# Patient Record
Sex: Male | Born: 1935 | Race: White | Hispanic: No | Smoking: Never smoker
Health system: Southern US, Community
[De-identification: ages and names within clinical notes are randomized; demographics above are authoritative.]

## PROBLEM LIST (undated history)

## (undated) DIAGNOSIS — E119 Type 2 diabetes mellitus without complications: Secondary | ICD-10-CM

## (undated) DIAGNOSIS — K635 Polyp of colon: Secondary | ICD-10-CM

## (undated) DIAGNOSIS — I10 Essential (primary) hypertension: Secondary | ICD-10-CM

## (undated) DIAGNOSIS — E7800 Pure hypercholesterolemia, unspecified: Secondary | ICD-10-CM

## (undated) HISTORY — PX: COLONOSCOPY: SHX174

## (undated) HISTORY — PX: KIDNEY STONE SURGERY: SHX686

## (undated) HISTORY — PX: OTHER SURGICAL HISTORY: SHX169

---

## 2002-07-14 ENCOUNTER — Ambulatory Visit (HOSPITAL_COMMUNITY): Admission: RE | Admit: 2002-07-14 | Discharge: 2002-07-14 | Payer: Self-pay | Admitting: Internal Medicine

## 2003-09-21 ENCOUNTER — Observation Stay (HOSPITAL_COMMUNITY): Admission: RE | Admit: 2003-09-21 | Discharge: 2003-09-22 | Payer: Self-pay | Admitting: General Surgery

## 2005-10-28 ENCOUNTER — Encounter (INDEPENDENT_AMBULATORY_CARE_PROVIDER_SITE_OTHER): Payer: Self-pay | Admitting: Specialist

## 2005-10-28 ENCOUNTER — Ambulatory Visit: Payer: Self-pay | Admitting: Internal Medicine

## 2005-10-28 ENCOUNTER — Ambulatory Visit (HOSPITAL_COMMUNITY): Admission: RE | Admit: 2005-10-28 | Discharge: 2005-10-28 | Payer: Self-pay | Admitting: Internal Medicine

## 2010-04-11 ENCOUNTER — Ambulatory Visit (HOSPITAL_COMMUNITY): Admission: RE | Admit: 2010-04-11 | Discharge: 2010-04-11 | Payer: Self-pay | Admitting: Urology

## 2010-12-06 ENCOUNTER — Other Ambulatory Visit (INDEPENDENT_AMBULATORY_CARE_PROVIDER_SITE_OTHER): Payer: Self-pay | Admitting: Internal Medicine

## 2010-12-06 ENCOUNTER — Ambulatory Visit (HOSPITAL_COMMUNITY)
Admission: RE | Admit: 2010-12-06 | Discharge: 2010-12-06 | Disposition: A | Discharge status: Home / Self Care | Payer: Medicare Other | Source: Ambulatory Visit | Attending: Internal Medicine | Admitting: Internal Medicine

## 2010-12-06 ENCOUNTER — Encounter (HOSPITAL_BASED_OUTPATIENT_CLINIC_OR_DEPARTMENT_OTHER): Payer: Medicare Other | Admitting: Internal Medicine

## 2010-12-06 DIAGNOSIS — D126 Benign neoplasm of colon, unspecified: Secondary | ICD-10-CM

## 2010-12-06 DIAGNOSIS — Z860100 Personal history of colon polyps, unspecified: Secondary | ICD-10-CM | POA: Insufficient documentation

## 2010-12-06 DIAGNOSIS — Z7982 Long term (current) use of aspirin: Secondary | ICD-10-CM | POA: Insufficient documentation

## 2010-12-06 DIAGNOSIS — E119 Type 2 diabetes mellitus without complications: Secondary | ICD-10-CM | POA: Insufficient documentation

## 2010-12-06 DIAGNOSIS — Z09 Encounter for follow-up examination after completed treatment for conditions other than malignant neoplasm: Secondary | ICD-10-CM | POA: Insufficient documentation

## 2010-12-06 LAB — GLUCOSE, CAPILLARY: Glucose-Capillary: 83 mg/dL (ref 70–99)

## 2010-12-10 ENCOUNTER — Other Ambulatory Visit (HOSPITAL_COMMUNITY): Payer: Medicare Other

## 2010-12-11 ENCOUNTER — Ambulatory Visit (HOSPITAL_COMMUNITY)
Admit: 2010-12-11 | Discharge: 2010-12-11 | Disposition: A | Discharge status: Home / Self Care | Payer: Medicare Other | Source: Ambulatory Visit | Attending: Internal Medicine | Admitting: Internal Medicine

## 2010-12-11 DIAGNOSIS — K224 Dyskinesia of esophagus: Secondary | ICD-10-CM | POA: Insufficient documentation

## 2010-12-11 DIAGNOSIS — R1310 Dysphagia, unspecified: Secondary | ICD-10-CM | POA: Insufficient documentation

## 2010-12-17 ENCOUNTER — Ambulatory Visit (HOSPITAL_COMMUNITY)
Admission: RE | Admit: 2010-12-17 | Discharge: 2010-12-17 | Disposition: A | Discharge status: Home / Self Care | Payer: Medicare Other | Source: Ambulatory Visit | Attending: Internal Medicine | Admitting: Internal Medicine

## 2010-12-17 DIAGNOSIS — R1310 Dysphagia, unspecified: Secondary | ICD-10-CM | POA: Insufficient documentation

## 2010-12-17 DIAGNOSIS — IMO0001 Reserved for inherently not codable concepts without codable children: Secondary | ICD-10-CM | POA: Insufficient documentation

## 2010-12-17 DIAGNOSIS — K224 Dyskinesia of esophagus: Secondary | ICD-10-CM | POA: Insufficient documentation

## 2010-12-21 NOTE — Op Note (Signed)
NAMESOREN, LAZARZ NO.:  [id number]   MEDICAL RECORD NO.:  [id number]          PATIENT TYPE:  AMB   LOCATION:  DAY                           FACILITY:  [facility]   PHYSICIAN:  Lionel December, M.D.    DATE OF BIRTH:  1930/11/11   DATE OF PROCEDURE:  10/28/2005  DATE OF DISCHARGE:                                 OPERATIVE REPORT   PROCEDURE:  Colonoscopy with removal of residual polyp via cold biopsy and  APC.   INDICATION:  Mr. Mizrahi is a 77 year old Caucasian male who underwent  screening colonoscopy in December 2003.  He was noted to have over a 2-cm  polyp at sigmoid colon which was a tubulovillous adenoma; and he had a  smaller tubular adenoma.  We, therefore, asked him to return at 3 years.  He  remains free of symptoms. Family history is negative for colorectal  carcinoma.  Procedure and risks were reviewed with the patient and informed  consent was obtained.   MEDICINES FOR CONSCIOUS SEDATION:  Demerol 25 mg IV, Versed 3 mg IV, and  atropine 0.5 mg IV for asymptomatic bradycardia.   FINDINGS:  Procedure performed in endoscopy suite.  The patient's vital  signs and O2 saturations were monitored during procedure and remained  stable.  The patient was placed in the left lateral position and rectal  examination performed.  No abnormality noted on external or digital exam.  Olympus videoscope was placed in the rectum and advanced under vision into  sigmoid colon and beyond.  Preparation was satisfactory, a very redundant  colon.  Some difficulty encountered in passing the scope across the hepatic  flexure into cecum which was finally possible with him, on his back, and  using abdominal pressure.  Cecum was identified by ileocecal valve and  appendiceal orifice.  Pictures taken for the record.  As the scope was  withdrawn colonic mucosa was carefully examined.  There was a residual polyp  at polypectomy site at sigmoid colon marked by scar.  This polyp was  ablated  via cold biopsy; and polypectomy site was treated with APC to make sure that  all of the residual polyp was removed and/or treated.  Pictures taken for  the record.  Mucosa of the distal sigmoid colon and rectum was normal.  Scope was retroflexed to examine anorectal junction and small hemorrhoids  were noted below the dentate line.  Endoscope was straightened and  withdrawn. The patient tolerated the procedure well.   FINAL DIAGNOSIS:  1.  Redundant colon.  2.  Residual polyp at previous polypectomy site which was removed via cold      biopsy and site treated with APC to treat the tiny foci left behind.  3.  External hemorrhoids.   RECOMMENDATIONS:  1.  Standard instructions given.  2.  He will resume his aspirin in 10 days.  3.  I will contact the patient with biopsy results.  4.  We will bring him back in 5 years.      Lionel December, M.D.  Electronically Signed  NR/MEDQ  D:  10/28/2005  T:  10/28/2005  Job:  [id number]

## 2010-12-21 NOTE — Op Note (Signed)
NAME:  Kyle Santana, Kyle Santana                             ACCOUNT NO.:  [id number]   MEDICAL RECORD NO.:  [id number]                   PATIENT TYPE:  AMB   LOCATION:  DAY                                  FACILITY:  [facility]   PHYSICIAN:  Lionel December, M.D.                 DATE OF BIRTH:  03/03/1931   DATE OF PROCEDURE:  DATE OF DISCHARGE:                                 OPERATIVE REPORT   PROCEDURE PERFORMED:  Total colonoscopy with polypectomy.   INDICATIONS FOR PROCEDURE:  The patient is a 77 year old Caucasian male who  is undergoing screening colonoscopy.  Family history is negative for CRC.  The risks were reviewed with the patient.  Informed consent was obtained.   PREMEDICATION:  Demerol 15 mg IV, Versed 3 mg IV in divided doses and  Atropine 0.3 mg IV.   INSTRUMENT:  Olympus video system.   FINDINGS:  The procedure performed in the endoscopy suite.  The patient's  vital signs and O2 saturations were monitored during the procedure and  remained stable except he had asymptomatic bradycardia, even prior to  starting the procedure.  He was given a small dose of Atropine to prevent  vasovagal phenomenon.  The patient was placed in the left lateral decubitus  position.  The rectal examination was performed.  No abnormality was noted  on external or digital exam.  The scope was placed in the rectum and  advanced under direct vision into the sigmoid colon and beyond.  The  preparation was excellent.  The scope was passed into the cecum.  The cecum  was identified by the appendiceal orifice and the ileocecal valve.  The  colon was somewhat redundant.  As the scope was withdrawn, the colonic  mucosa was carefully examined, was normal except the distal sigmoid colon  really had a large polyp which was about 3 cm in diameter and a small polyp  which was 8-9 mm.  Both of these polyps were snared and retrieved for  histologic examination.  The mucosa of the distal sigmoid colon and rectum  was  normal.  The scope was retroflexed and examined.  Anorectal junction  hemorrhoids were noted below the dentate line.  The endoscope was  straightened and withdrawn.  The patient tolerated the procedure well.   FINAL DIAGNOSES:  1. Examination was performed to the cecum.  2. Two polyps snared from the sigmoid colon, the larger of the two was 3 cm     in diameter, the other one was 8-9 mm.  3. External hemorrhoids.    RECOMMENDATIONS:  1. Standard instructions given.  I will be contacting the patient with     biopsy results.  2. Will consider bringing him back in three years from now given     colonoscopic findings.  Lionel December, M.D.    NR/MEDQ  D:  07/14/2002  T:  07/14/2002  Job:  [id number]   cc:   Dorise Hiss, M.D.  [address]  [place]  [place] [address]  Fax: [phone]

## 2010-12-21 NOTE — Op Note (Signed)
NAME:  Kyle Santana, Kyle Santana NO.:  [id number]   MEDICAL RECORD NO.:  [id number]                   PATIENT TYPE:  AMB   LOCATION:  DAY                                  FACILITY:  [facility]   PHYSICIAN:  Barbaraann Barthel, M.D.              DATE OF BIRTH:  12/19/1930   DATE OF PROCEDURE:  09/21/2003  DATE OF DISCHARGE:                                 OPERATIVE REPORT   SURGEON:  Barbaraann Barthel, M.D.   PREOPERATIVE DIAGNOSES:  Left inguinal hernia.   POSTOPERATIVE DIAGNOSES:  Left inguinal hernia.   PROCEDURE:  Left inguinal hernia repair, modified McVay repair with mesh  plug.   Note this is a 77 year old male who was seen in my office with a left  inguinal hernia. We discussed elective repair. I did note that he had some  bradycardia and he was a type 2 diabetic and I referred him to the  cardiologist to clear for surgery.  He was cleared for surgery and then we  took him to surgery via the outpatient department after discussing the  procedure with him in detail and discussing the complications not limited to  but including bleeding, infection and recurrence. Informed consent was  obtained.   FINDINGS:  The patient had a large direct inguinal hernia, no indirect  component.   SPECIMENS:  None.   The patient was taken to the operating room and after adequate  administration of spinal anesthesia, his entire abdomen was prepped with  Betadine solution and draped in the usual manner and prior to this a Foley  catheter was aseptically inserted.  A low transverse incision was carried  out through skin, subcutaneous tissue and Scarpa's layer down to the  external oblique which was opened. The cord structures were dissected free  from a medial large direct hernia sac.  I elected to place a moderate mesh  plug in inverting the hernia sac within the abdominal wall and then suturing  this plug circumferentially with old novofil suture to the fascia  circumferentially.  I then sutured transversalis abdominis and transversus  fascia to Cooper's ligament and Poupart's ligament with interrupted 2-0  Bralon sutures. We then irrigated with normal saline solution, I performed a  relaxing excision prior to suturing the Bralon sutures tightly. We then used  approximately 8 mL of 0.5%  Sensorcaine to add with postoperative comfort  and then I closed the external oblique with a running 3-0 Polysorb suture  and the subcu with a couple of interrupted  3-0 Polysorb sutures and then irrigated the subcu and closed with a stapling  device. Prior to closure, all sponge, needle and instrument counts were  correct.  Estimated blood loss was minimal. The patient received  approximately 450 mL of crystalloids intraoperatively.  No drains were  placed, there were no complications.      ___________________________________________  Barbaraann Barthel, M.D.   WB/MEDQ  D:  09/21/2003  T:  09/22/2003  Job:  [id number]   cc:   [facility]

## 2010-12-25 NOTE — Op Note (Signed)
  NAMEPATTRICK, BADY NO.:  [id number]  MEDICAL RECORD NO.:  [id number]           PATIENT TYPE:  O  LOCATION:  DAYP                          FACILITY:  [facility]  PHYSICIAN:  Lionel December, M.D.    DATE OF BIRTH:  May 23, 1931  DATE OF PROCEDURE:  12/06/2010 DATE OF DISCHARGE:                              OPERATIVE REPORT   PROCEDURE:  Colonoscopy with snare polypectomy.  INDICATIONS:  Vidit is 77 year old Caucasian male with history of colonic polyps.  He had a large tubulovillous adenoma removed from the sigmoid colon back in 2003.  His last exam was in 2007, and he had possibly residual polyp removed which was an adenoma.  He is undergoing surveillance colonoscopy.  Procedures were reviewed with the patient. Informed consent was obtained.  MEDS FOR CONSCIOUS SEDATION:  Demerol 25 mg IV, Versed 3 mg IV, atropine 0.5 mg IV given prior to the procedure for a symptomatic bradycardia.  FINDINGS:  Procedure performed in endoscopy suite.  The patient's vital signs and O2 sat were monitored during the procedure and remained stable.  The patient was placed in left lateral recumbent position. Rectal examination performed.  No abnormality noted on external or digital exam.  Pentax videoscope was placed through rectum and advanced under vision into sigmoid colon and beyond.  Preparation was excellent. Very redundant colon.  Scope was passed into cecum which was identified by appendiceal orifice and ileocecal valve.  As the scope was withdrawn, colonic mucosa was carefully examined.  There was 7-8 mm polyp at splenic flexure with difficult approach and was finally snared. Polypectomy was complete.  Polyp was retrieved, but broke into two pieces.  Mucosa and rest of the colon was normal.  Scar was noted at sigmoid colon site where he had large adenoma removed 9 years ago. Rectal mucosa was normal.  Scope was retroflexed to examine anorectal junction which was unremarkable.   Endoscope was then withdrawn. Withdrawal time was over 20 minutes.  The patient tolerated the procedure well.  FINAL DIAGNOSIS: 1. Examination performed to cecum. 2. Redundant colon. 3. A 7-8 mm polyp snared from splenic flexure. 4. Scar noted at sigmoid colon site of previous polypectomy.  RECOMMENDATIONS: 1. Standard instructions given. 2. No aspirin for 10 days.  We will schedule him for barium pill     esophagogram to further evaluate his dysphagia which apparently he     has been experienced for the last 8 months.          ______________________________ Lionel December, M.D.     NR/MEDQ  D:  12/06/2010  T:  12/06/2010  Job:  [id number]  cc:   Lia Hopping Fax: [phone]  Electronically Signed by Lionel December M.D. on 12/25/2010 12:22:39 PM

## 2011-05-03 DIAGNOSIS — N200 Calculus of kidney: Secondary | ICD-10-CM | POA: Insufficient documentation

## 2011-05-03 DIAGNOSIS — N289 Disorder of kidney and ureter, unspecified: Secondary | ICD-10-CM | POA: Insufficient documentation

## 2011-10-31 DIAGNOSIS — N390 Urinary tract infection, site not specified: Secondary | ICD-10-CM | POA: Insufficient documentation

## 2012-07-10 ENCOUNTER — Ambulatory Visit (INDEPENDENT_AMBULATORY_CARE_PROVIDER_SITE_OTHER): Payer: Medicare Other | Admitting: Urology

## 2012-07-10 ENCOUNTER — Ambulatory Visit (HOSPITAL_COMMUNITY)
Admission: RE | Admit: 2012-07-10 | Discharge: 2012-07-10 | Disposition: A | Discharge status: Home / Self Care | Payer: Medicare Other | Source: Ambulatory Visit | Attending: Urology | Admitting: Urology

## 2012-07-10 ENCOUNTER — Other Ambulatory Visit: Payer: Self-pay | Admitting: Urology

## 2012-07-10 DIAGNOSIS — N390 Urinary tract infection, site not specified: Secondary | ICD-10-CM

## 2012-07-10 DIAGNOSIS — N200 Calculus of kidney: Secondary | ICD-10-CM

## 2012-07-10 DIAGNOSIS — N476 Balanoposthitis: Secondary | ICD-10-CM

## 2012-07-10 DIAGNOSIS — N402 Nodular prostate without lower urinary tract symptoms: Secondary | ICD-10-CM

## 2012-07-13 ENCOUNTER — Ambulatory Visit (HOSPITAL_COMMUNITY)
Admission: RE | Admit: 2012-07-13 | Discharge: 2012-07-13 | Disposition: A | Discharge status: Home / Self Care | Payer: Medicare Other | Source: Ambulatory Visit | Attending: Urology | Admitting: Urology

## 2012-07-13 ENCOUNTER — Other Ambulatory Visit (HOSPITAL_COMMUNITY): Payer: Medicare Other

## 2012-07-13 DIAGNOSIS — N200 Calculus of kidney: Secondary | ICD-10-CM | POA: Insufficient documentation

## 2012-07-16 ENCOUNTER — Ambulatory Visit (HOSPITAL_COMMUNITY): Payer: Medicare Other

## 2012-12-11 ENCOUNTER — Other Ambulatory Visit: Payer: Self-pay | Admitting: Urology

## 2012-12-11 DIAGNOSIS — N200 Calculus of kidney: Secondary | ICD-10-CM

## 2013-01-08 ENCOUNTER — Ambulatory Visit (HOSPITAL_COMMUNITY): Payer: Medicare Other

## 2013-01-11 ENCOUNTER — Ambulatory Visit (HOSPITAL_COMMUNITY)
Admission: RE | Admit: 2013-01-11 | Discharge: 2013-01-11 | Disposition: A | Discharge status: Home / Self Care | Payer: Medicare Other | Source: Ambulatory Visit | Attending: Urology | Admitting: Urology

## 2013-01-11 ENCOUNTER — Other Ambulatory Visit: Payer: Self-pay | Admitting: Urology

## 2013-01-11 ENCOUNTER — Ambulatory Visit (HOSPITAL_COMMUNITY): Payer: Medicare Other

## 2013-01-11 DIAGNOSIS — N200 Calculus of kidney: Secondary | ICD-10-CM | POA: Insufficient documentation

## 2013-01-11 DIAGNOSIS — Z09 Encounter for follow-up examination after completed treatment for conditions other than malignant neoplasm: Secondary | ICD-10-CM | POA: Insufficient documentation

## 2013-01-15 ENCOUNTER — Ambulatory Visit (INDEPENDENT_AMBULATORY_CARE_PROVIDER_SITE_OTHER): Payer: Medicare Other | Admitting: Urology

## 2013-01-15 DIAGNOSIS — N135 Crossing vessel and stricture of ureter without hydronephrosis: Secondary | ICD-10-CM

## 2013-01-15 DIAGNOSIS — N390 Urinary tract infection, site not specified: Secondary | ICD-10-CM

## 2013-01-15 DIAGNOSIS — N200 Calculus of kidney: Secondary | ICD-10-CM

## 2013-10-15 ENCOUNTER — Other Ambulatory Visit: Payer: Self-pay | Admitting: Urology

## 2013-10-15 DIAGNOSIS — N200 Calculus of kidney: Secondary | ICD-10-CM

## 2013-12-20 ENCOUNTER — Ambulatory Visit (HOSPITAL_COMMUNITY)
Admission: RE | Admit: 2013-12-20 | Discharge: 2013-12-20 | Disposition: A | Discharge status: Home / Self Care | Payer: Medicare Other | Source: Ambulatory Visit | Attending: Urology | Admitting: Urology

## 2013-12-20 ENCOUNTER — Other Ambulatory Visit: Payer: Self-pay | Admitting: Urology

## 2013-12-20 DIAGNOSIS — N200 Calculus of kidney: Secondary | ICD-10-CM

## 2013-12-20 DIAGNOSIS — M47817 Spondylosis without myelopathy or radiculopathy, lumbosacral region: Secondary | ICD-10-CM | POA: Insufficient documentation

## 2013-12-20 DIAGNOSIS — K5900 Constipation, unspecified: Secondary | ICD-10-CM | POA: Insufficient documentation

## 2013-12-24 ENCOUNTER — Ambulatory Visit (INDEPENDENT_AMBULATORY_CARE_PROVIDER_SITE_OTHER): Payer: Medicare Other | Admitting: Urology

## 2013-12-24 DIAGNOSIS — N200 Calculus of kidney: Secondary | ICD-10-CM

## 2013-12-24 DIAGNOSIS — N390 Urinary tract infection, site not specified: Secondary | ICD-10-CM

## 2013-12-24 DIAGNOSIS — N476 Balanoposthitis: Secondary | ICD-10-CM

## 2014-11-22 ENCOUNTER — Other Ambulatory Visit: Payer: Self-pay | Admitting: Urology

## 2014-11-22 DIAGNOSIS — N200 Calculus of kidney: Secondary | ICD-10-CM

## 2014-12-22 ENCOUNTER — Ambulatory Visit (HOSPITAL_COMMUNITY)
Admission: RE | Admit: 2014-12-22 | Discharge: 2014-12-22 | Disposition: A | Discharge status: Home / Self Care | Payer: Medicare Other | Source: Ambulatory Visit | Attending: Urology | Admitting: Urology

## 2014-12-22 DIAGNOSIS — N200 Calculus of kidney: Secondary | ICD-10-CM | POA: Diagnosis present

## 2014-12-22 DIAGNOSIS — N281 Cyst of kidney, acquired: Secondary | ICD-10-CM | POA: Insufficient documentation

## 2014-12-22 DIAGNOSIS — R934 Abnormal findings on diagnostic imaging of urinary organs: Secondary | ICD-10-CM | POA: Diagnosis not present

## 2014-12-30 ENCOUNTER — Ambulatory Visit (INDEPENDENT_AMBULATORY_CARE_PROVIDER_SITE_OTHER): Payer: Medicare Other | Admitting: Urology

## 2014-12-30 DIAGNOSIS — R339 Retention of urine, unspecified: Secondary | ICD-10-CM | POA: Diagnosis not present

## 2015-11-01 DIAGNOSIS — E1142 Type 2 diabetes mellitus with diabetic polyneuropathy: Secondary | ICD-10-CM | POA: Diagnosis not present

## 2015-11-01 DIAGNOSIS — Z6820 Body mass index (BMI) 20.0-20.9, adult: Secondary | ICD-10-CM | POA: Diagnosis not present

## 2015-11-01 DIAGNOSIS — I10 Essential (primary) hypertension: Secondary | ICD-10-CM | POA: Diagnosis not present

## 2015-11-01 DIAGNOSIS — E784 Other hyperlipidemia: Secondary | ICD-10-CM | POA: Diagnosis not present

## 2015-11-01 DIAGNOSIS — Z0000 Encounter for general adult medical examination without abnormal findings: Secondary | ICD-10-CM | POA: Diagnosis not present

## 2015-11-09 DIAGNOSIS — Z961 Presence of intraocular lens: Secondary | ICD-10-CM | POA: Diagnosis not present

## 2015-12-19 ENCOUNTER — Encounter (INDEPENDENT_AMBULATORY_CARE_PROVIDER_SITE_OTHER): Payer: Self-pay | Admitting: *Deleted

## 2016-01-03 ENCOUNTER — Other Ambulatory Visit (INDEPENDENT_AMBULATORY_CARE_PROVIDER_SITE_OTHER): Payer: Self-pay | Admitting: *Deleted

## 2016-01-03 ENCOUNTER — Ambulatory Visit: Payer: Medicare Other | Admitting: Urology

## 2016-01-03 DIAGNOSIS — Z86010 Personal history of colonic polyps: Secondary | ICD-10-CM

## 2016-01-19 ENCOUNTER — Other Ambulatory Visit: Payer: Self-pay | Admitting: Urology

## 2016-01-19 DIAGNOSIS — N200 Calculus of kidney: Secondary | ICD-10-CM

## 2016-01-24 ENCOUNTER — Ambulatory Visit (HOSPITAL_COMMUNITY)
Admission: RE | Admit: 2016-01-24 | Discharge: 2016-01-24 | Disposition: A | Discharge status: Home / Self Care | Payer: Medicare Other | Source: Ambulatory Visit | Attending: Urology | Admitting: Urology

## 2016-01-24 ENCOUNTER — Ambulatory Visit (INDEPENDENT_AMBULATORY_CARE_PROVIDER_SITE_OTHER): Payer: Medicare Other | Admitting: Urology

## 2016-01-24 DIAGNOSIS — N202 Calculus of kidney with calculus of ureter: Secondary | ICD-10-CM | POA: Diagnosis not present

## 2016-01-24 DIAGNOSIS — N200 Calculus of kidney: Secondary | ICD-10-CM | POA: Insufficient documentation

## 2016-01-24 DIAGNOSIS — N281 Cyst of kidney, acquired: Secondary | ICD-10-CM | POA: Diagnosis not present

## 2016-01-24 DIAGNOSIS — N2889 Other specified disorders of kidney and ureter: Secondary | ICD-10-CM | POA: Diagnosis not present

## 2016-01-26 ENCOUNTER — Other Ambulatory Visit: Payer: Self-pay | Admitting: Urology

## 2016-01-26 DIAGNOSIS — N202 Calculus of kidney with calculus of ureter: Secondary | ICD-10-CM

## 2016-02-05 ENCOUNTER — Other Ambulatory Visit (INDEPENDENT_AMBULATORY_CARE_PROVIDER_SITE_OTHER): Payer: Self-pay | Admitting: *Deleted

## 2016-02-05 ENCOUNTER — Encounter (INDEPENDENT_AMBULATORY_CARE_PROVIDER_SITE_OTHER): Payer: Self-pay | Admitting: *Deleted

## 2016-02-05 ENCOUNTER — Telehealth (INDEPENDENT_AMBULATORY_CARE_PROVIDER_SITE_OTHER): Payer: Self-pay | Admitting: *Deleted

## 2016-02-05 NOTE — Telephone Encounter (Signed)
Referring MD/PCP: hasanaj   Procedure: tcs  Reason/Indication:  Hx polyps  Has patient had this procedure before?  Yes, 2012 -- epic  If so, when, by whom and where?    Is there a family history of colon cancer?  no  Who?  What age when diagnosed?    Is patient diabetic?   yes      Does patient have prosthetic heart valve or mechanical valve?  no  Do you have a pacemaker?  no  Has patient ever had endocarditis? no  Has patient had joint replacement within last 12 months?  no  Does patient tend to be constipated or take laxatives? no  Does patient have a history of alcohol/drug use?  no  Is patient on Coumadin, Plavix and/or Aspirin? yes  Medications: asa 81 mg bid, eye vit daily, simvastatin 40 mg daily, metformin 2000 mg bid, calcium 600 + D daily, benazepril 5 mg daily  Allergies: nkda  Medication Adjustment: asa  2 days, hold metformin evening before & morning of  Procedure date & time: 03/06/16 at 1200

## 2016-02-05 NOTE — Telephone Encounter (Signed)
Patient needs trilyte 

## 2016-02-07 DIAGNOSIS — I10 Essential (primary) hypertension: Secondary | ICD-10-CM | POA: Diagnosis not present

## 2016-02-07 DIAGNOSIS — E1142 Type 2 diabetes mellitus with diabetic polyneuropathy: Secondary | ICD-10-CM | POA: Diagnosis not present

## 2016-02-07 DIAGNOSIS — Z6820 Body mass index (BMI) 20.0-20.9, adult: Secondary | ICD-10-CM | POA: Diagnosis not present

## 2016-02-07 DIAGNOSIS — E784 Other hyperlipidemia: Secondary | ICD-10-CM | POA: Diagnosis not present

## 2016-02-07 NOTE — Telephone Encounter (Signed)
agree

## 2016-02-08 MED ORDER — PEG 3350-KCL-NA BICARB-NACL 420 G PO SOLR: 4000.0000 mL | Freq: Once | ORAL | Status: DC

## 2016-02-12 ENCOUNTER — Ambulatory Visit (HOSPITAL_COMMUNITY): Admission: RE | Admit: 2016-02-12 | Payer: Medicare Other | Source: Ambulatory Visit

## 2016-02-16 ENCOUNTER — Ambulatory Visit (HOSPITAL_COMMUNITY)
Admission: RE | Admit: 2016-02-16 | Discharge: 2016-02-16 | Disposition: A | Discharge status: Home / Self Care | Payer: Medicare Other | Source: Ambulatory Visit | Attending: Urology | Admitting: Urology

## 2016-02-16 DIAGNOSIS — N202 Calculus of kidney with calculus of ureter: Secondary | ICD-10-CM | POA: Diagnosis present

## 2016-02-28 ENCOUNTER — Ambulatory Visit (INDEPENDENT_AMBULATORY_CARE_PROVIDER_SITE_OTHER): Payer: Medicare Other | Admitting: Urology

## 2016-02-28 DIAGNOSIS — N200 Calculus of kidney: Secondary | ICD-10-CM

## 2016-02-28 DIAGNOSIS — N476 Balanoposthitis: Secondary | ICD-10-CM | POA: Diagnosis not present

## 2016-03-06 ENCOUNTER — Encounter (HOSPITAL_COMMUNITY)
Admission: RE | Disposition: A | Discharge status: Home / Self Care | Payer: Self-pay | Source: Ambulatory Visit | Attending: Internal Medicine

## 2016-03-06 ENCOUNTER — Encounter (HOSPITAL_COMMUNITY): Payer: Self-pay | Admitting: *Deleted

## 2016-03-06 ENCOUNTER — Ambulatory Visit (HOSPITAL_COMMUNITY)
Admission: RE | Admit: 2016-03-06 | Discharge: 2016-03-06 | Disposition: A | Discharge status: Home / Self Care | Payer: Medicare Other | Source: Ambulatory Visit | Attending: Internal Medicine | Admitting: Internal Medicine

## 2016-03-06 DIAGNOSIS — Z1211 Encounter for screening for malignant neoplasm of colon: Secondary | ICD-10-CM | POA: Insufficient documentation

## 2016-03-06 DIAGNOSIS — Z7982 Long term (current) use of aspirin: Secondary | ICD-10-CM | POA: Diagnosis not present

## 2016-03-06 DIAGNOSIS — Z860100 Personal history of colon polyps, unspecified: Secondary | ICD-10-CM

## 2016-03-06 DIAGNOSIS — K5520 Angiodysplasia of colon without hemorrhage: Secondary | ICD-10-CM | POA: Diagnosis not present

## 2016-03-06 DIAGNOSIS — Z79899 Other long term (current) drug therapy: Secondary | ICD-10-CM | POA: Diagnosis not present

## 2016-03-06 DIAGNOSIS — I10 Essential (primary) hypertension: Secondary | ICD-10-CM | POA: Insufficient documentation

## 2016-03-06 DIAGNOSIS — Z09 Encounter for follow-up examination after completed treatment for conditions other than malignant neoplasm: Secondary | ICD-10-CM | POA: Diagnosis not present

## 2016-03-06 DIAGNOSIS — Z7984 Long term (current) use of oral hypoglycemic drugs: Secondary | ICD-10-CM | POA: Insufficient documentation

## 2016-03-06 DIAGNOSIS — D123 Benign neoplasm of transverse colon: Secondary | ICD-10-CM | POA: Insufficient documentation

## 2016-03-06 DIAGNOSIS — K648 Other hemorrhoids: Secondary | ICD-10-CM | POA: Diagnosis not present

## 2016-03-06 DIAGNOSIS — E119 Type 2 diabetes mellitus without complications: Secondary | ICD-10-CM | POA: Diagnosis not present

## 2016-03-06 DIAGNOSIS — E7800 Pure hypercholesterolemia, unspecified: Secondary | ICD-10-CM | POA: Diagnosis not present

## 2016-03-06 HISTORY — DX: Type 2 diabetes mellitus without complications: E11.9

## 2016-03-06 HISTORY — DX: Pure hypercholesterolemia, unspecified: E78.00

## 2016-03-06 HISTORY — DX: Essential (primary) hypertension: I10

## 2016-03-06 HISTORY — DX: Polyp of colon: K63.5

## 2016-03-06 HISTORY — PX: COLONOSCOPY: SHX5424

## 2016-03-06 LAB — GLUCOSE, CAPILLARY
Glucose-Capillary: 65 mg/dL (ref 65–99)
Glucose-Capillary: 75 mg/dL (ref 65–99)
Glucose-Capillary: 174 mg/dL — ABNORMAL HIGH (ref 65–99)

## 2016-03-06 SURGERY — COLONOSCOPY
Anesthesia: Moderate Sedation

## 2016-03-06 MED ORDER — MIDAZOLAM HCL 5 MG/5ML IJ SOLN
INTRAMUSCULAR | Status: AC
  Filled 2016-03-06: qty 10

## 2016-03-06 MED ORDER — DEXTROSE-NACL 5-0.9 % IV SOLN
INTRAVENOUS | Status: DC
  Administered 2016-03-06: 11:00:00 via INTRAVENOUS

## 2016-03-06 MED ORDER — SODIUM CHLORIDE 0.9 % IV SOLN
INTRAVENOUS | Status: DC
  Administered 2016-03-06: 11:00:00 via INTRAVENOUS

## 2016-03-06 MED ORDER — MEPERIDINE HCL 50 MG/ML IJ SOLN
INTRAMUSCULAR | Status: DC | PRN
  Administered 2016-03-06: 25 mg

## 2016-03-06 MED ORDER — MIDAZOLAM HCL 5 MG/5ML IJ SOLN
INTRAMUSCULAR | Status: DC | PRN
  Administered 2016-03-06 (×2): 1 mg via INTRAVENOUS

## 2016-03-06 MED ORDER — MEPERIDINE HCL 50 MG/ML IJ SOLN
INTRAMUSCULAR | Status: AC
  Filled 2016-03-06: qty 1

## 2016-03-06 NOTE — Discharge Instructions (Signed)
No aspirin or NSAIDs for 3 days. Resume other medications and diet as before. No driving for 24 hours. Physician will call with biopsy results.   Colonoscopy, Care After These instructions give you information on caring for yourself after your procedure. Your doctor may also give you more specific instructions. Call your doctor if you have any problems or questions after your procedure. HOME CARE  Do not drive for 24 hours.  Do not sign important papers or use machinery for 24 hours.  You may shower.  You may go back to your usual activities, but go slower for the first 24 hours.  Take rest breaks often during the first 24 hours.  Walk around or use warm packs on your belly (abdomen) if you have belly cramping or gas.  Drink enough fluids to keep your pee (urine) clear or pale yellow.  Resume your normal diet. Avoid heavy or fried foods.  Avoid drinking alcohol for 24 hours or as told by your doctor.  Only take medicines as told by your doctor. If a tissue sample (biopsy) was taken during the procedure:   Do not take aspirin or blood thinners for 7 days, or as told by your doctor.  Do not drink alcohol for 7 days, or as told by your doctor.  Eat soft foods for the first 24 hours. GET HELP IF: You still have a small amount of blood in your poop (stool) 2-3 days after the procedure. GET HELP RIGHT AWAY IF:  You have more than a small amount of blood in your poop.  You see clumps of tissue (blood clots) in your poop.  Your belly is puffy (swollen).  You feel sick to your stomach (nauseous) or throw up (vomit).  You have a fever.  You have belly pain that gets worse and medicine does not help. MAKE SURE YOU:  Understand these instructions.  Will watch your condition.  Will get help right away if you are not doing well or get worse.   This information is not intended to replace advice given to you by your health care provider. Make sure you discuss any questions  you have with your health care provider.   Document Released: 08/24/2010 Document Revised: 07/27/2013 Document Reviewed: 03/29/2013 Elsevier Interactive Patient Education 2016 [employer]   Colon Polyps Polyps are lumps of extra tissue growing inside the body. Polyps can grow in the large intestine (colon). Most colon polyps are noncancerous (benign). However, some colon polyps can become cancerous over time. Polyps that are larger than a pea may be harmful. To be safe, caregivers remove and test all polyps. CAUSES  Polyps form when mutations in the genes cause your cells to grow and divide even though no more tissue is needed. RISK FACTORS There are a number of risk factors that can increase your chances of getting colon polyps. They include:  Being older than 50 years.  Family history of colon polyps or colon cancer.  Long-term colon diseases, such as colitis or Crohn disease.  Being overweight.  Smoking.  Being inactive.  Drinking too much alcohol. SYMPTOMS  Most small polyps do not cause symptoms. If symptoms are present, they may include:  Blood in the stool. The stool may look dark red or black.  Constipation or diarrhea that lasts longer than 1 week. DIAGNOSIS People often do not know they have polyps until their caregiver finds them during a regular checkup. Your caregiver can use 4 tests to check for polyps:  Digital rectal exam.  The caregiver wears gloves and feels inside the rectum. This test would find polyps only in the rectum.  Barium enema. The caregiver puts a liquid called barium into your rectum before taking X-rays of your colon. Barium makes your colon look white. Polyps are dark, so they are easy to see in the X-ray pictures.  Sigmoidoscopy. A thin, flexible tube (sigmoidoscope) is placed into your rectum. The sigmoidoscope has a light and tiny camera in it. The caregiver uses the sigmoidoscope to look at the last third of your colon.  Colonoscopy.  This test is like sigmoidoscopy, but the caregiver looks at the entire colon. This is the most common method for finding and removing polyps. TREATMENT  Any polyps will be removed during a sigmoidoscopy or colonoscopy. The polyps are then tested for cancer. PREVENTION  To help lower your risk of getting more colon polyps:  Eat plenty of fruits and vegetables. Avoid eating fatty foods.  Do not smoke.  Avoid drinking alcohol.  Exercise every day.  Lose weight if recommended by your caregiver.  Eat plenty of calcium and folate. Foods that are rich in calcium include milk, cheese, and broccoli. Foods that are rich in folate include chickpeas, kidney beans, and spinach. HOME CARE INSTRUCTIONS Keep all follow-up appointments as directed by your caregiver. You may need periodic exams to check for polyps. SEEK MEDICAL CARE IF: You notice bleeding during a bowel movement.   This information is not intended to replace advice given to you by your health care provider. Make sure you discuss any questions you have with your health care provider.   Document Released: 04/17/2004 Document Revised: 08/12/2014 Document Reviewed: 10/01/2011 Elsevier Interactive Patient Education [employer].

## 2016-03-06 NOTE — OR Nursing (Signed)
Dr. Laural Golden notified of blood sugar of 65. Order received of D5NS.

## 2016-03-06 NOTE — H&P (Signed)
Kyle Santana is an 82 y.o. male.   Chief Complaint: Patient is here for colonoscopy. HPI: Patient is an 82-year-old Caucasian male who was history of tubulovillous and tubular adenoma who is here for surveillance colonoscopy. Last exam was 5 years ago. He denies abdominal pain change in bowel habits or rectal bleeding. He states his had problems with urolithiasis and as result has lost a lot of weight. He is scheduled to have cystoscopy for stone extraction in about 2 weeks. Family history is negative for CRC.  Past Medical History:  Diagnosis Date  . Colon polyps   . Diabetes mellitus without complication ([place])   . Hypercholesteremia   . Hypertension     Past Surgical History:  Procedure Laterality Date  . COLONOSCOPY    . Left inguinal hernia repair      History reviewed. No pertinent family history. Social History:  reports that he has never smoked. He has never used smokeless tobacco. He reports that he does not drink alcohol or use drugs.  Allergies: No Known Allergies  Medications Prior to Admission  Medication Sig Dispense Refill  . aspirin EC 81 MG tablet Take 81 mg by mouth daily.    . benazepril (LOTENSIN) 5 MG tablet Take 1 tablet by mouth daily.    . calcium carbonate (OSCAL) 1500 (600 Ca) MG TABS tablet Take 600 mg of elemental calcium by mouth 2 (two) times daily with a meal.    . metFORMIN (GLUCOPHAGE) 1000 MG tablet Take 1 tablet by mouth 2 (two) times daily.    . Multiple Vitamins-Minerals (EYE VITAMINS PO) Take 1 tablet by mouth daily.    . polyethylene glycol-electrolytes (TRILYTE) 420 g solution Take 4,000 mLs by mouth once. 4000 mL 0  . simvastatin (ZOCOR) 40 MG tablet Take 1 tablet by mouth daily.      Results for orders placed or performed during the hospital encounter of 03/06/16 (from the past 48 hour(s))  Glucose, capillary     Status: None   Collection Time: 03/06/16 10:51 AM  Result Value Ref Range   Glucose-Capillary 65 65 - 99 mg/dL   No results  found.  ROS  Blood pressure (!) 116/92, pulse 89, temperature 98.1 F (36.7 C), temperature source Oral, resp. rate 15, height 5\' 7"  (1.702 m), weight 125 lb (56.7 kg), SpO2 92 %. Physical Exam  Constitutional:  Well-developed thin Caucasian male in NAD.  HENT:  Mouth/Throat: Oropharynx is clear and moist.  Eyes: Conjunctivae are normal. No scleral icterus.  Neck: No thyromegaly present.  Cardiovascular: Normal rate, regular rhythm and normal heart sounds.   No murmur heard. Respiratory: Effort normal and breath sounds normal.  GI: Soft. He exhibits no distension and no mass. There is no tenderness.  Musculoskeletal: He exhibits no edema.  Lymphadenopathy:    He has no cervical adenopathy.  Neurological: He is alert.  Skin: Skin is warm and dry.     Assessment/Plan History of colonic adenomas. Surveillance colonoscopy.  Hildred Laser, MD 03/06/2016, 11:35 AM

## 2016-03-06 NOTE — Op Note (Signed)
[facility] Patient Name: Kyle Santana Procedure Date: 03/06/2016 11:28 AM MRN: [id number] Date of Birth: [birth year] Attending MD: Hildred Laser , MD CSN: [id number] Age: 82 Admit Type: Outpatient Procedure:                Colonoscopy Indications:              High risk colon cancer surveillance: Personal                            history of colonic polyps Providers:                Hildred Laser, MD, Jeralyn Bennett, RN, Isabella Stalling, Technician Referring MD:             Stoney Bang MD, Medicines:                Meperidine 25 mg IV, Midazolam 2 mg IV Complications:            No immediate complications. Estimated Blood Loss:     Estimated blood loss was minimal. Procedure:                Pre-Anesthesia Assessment:                           - Prior to the procedure, a History and Physical                            was performed, and patient medications and                            allergies were reviewed. The patient's tolerance of                            previous anesthesia was also reviewed. The risks                            and benefits of the procedure and the sedation                            options and risks were discussed with the patient.                            All questions were answered, and informed consent                            was obtained. Prior Anticoagulants: The patient                            last took aspirin 2 days prior to the procedure.                            ASA Grade Assessment: II - A patient with mild  systemic disease. After reviewing the risks and                            benefits, the patient was deemed in satisfactory                            condition to undergo the procedure.                           After obtaining informed consent, the colonoscope                            was passed under direct vision. Throughout the                            procedure, the  patient's blood pressure, pulse, and                            oxygen saturations were monitored continuously. The                            EC-[id number]) scope was introduced through                            the anus and advanced to the the cecum, identified                            by appendiceal orifice and ileocecal valve. The                            colonoscopy was performed without difficulty. The                            patient tolerated the procedure well. The quality                            of the bowel preparation was adequate. The                            ileocecal valve, appendiceal orifice, and rectum                            were photographed. Scope In: 11:42:32 AM Scope Out: 12:07:20 PM Scope Withdrawal Time: 0 hours 8 minutes 17 seconds  Total Procedure Duration: 0 hours 24 minutes 48 seconds  Findings:      small nonbleeding cecal AVM.      Two sessile polyps were found in the hepatic flexure. The polyps were 7       mm in size. These polyps were removed with a cold snare. Resection and       retrieval were complete. The pathology specimen was placed into Bottle       Number 1.      The exam was otherwise normal throughout the examined colon.      Internal hemorrhoids were found during retroflexion. The hemorrhoids  were medium-sized. Impression:               - Two 7 mm polyps at the hepatic flexure, removed                            with a cold snare. Resected and retrieved.                           - Small nonbleeding cecal AVM.                           - Internal hemorrhoids. Moderate Sedation:      Moderate (conscious) sedation was administered by the endoscopy nurse       and supervised by the endoscopist. The following parameters were       monitored: oxygen saturation, heart rate, blood pressure, CO2       capnography and response to care. Total physician intraservice time was       27 minutes. Recommendation:           -  Patient has a contact number available for                            emergencies. The signs and symptoms of potential                            delayed complications were discussed with the                            patient. Return to normal activities tomorrow.                            Written discharge instructions were provided to the                            patient.                           - Resume previous diet today.                           - Continue present medications.                           - Resume aspirin at prior dose in 3 days.                           - Await pathology results.                           - No repeat colonoscopy due to age. Procedure Code(s):        --- Professional ---                           [phone], Colonoscopy, flexible; with removal of  tumor(s), polyp(s), or other lesion(s) by snare                            technique                           99152, Moderate sedation services provided by the                            same physician or other qualified health care                            professional performing the diagnostic or                            therapeutic service that the sedation supports,                            requiring the presence of an independent trained                            observer to assist in the monitoring of the                            patient's level of consciousness and physiological                            status; initial 15 minutes of intraservice time,                            patient age 84 years or older                           [phone], Moderate sedation services; each additional                            15 minutes intraservice time Diagnosis Code(s):        --- Professional ---                           Z86.010, Personal history of colonic polyps                           D12.3, Benign neoplasm of transverse colon (hepatic                             flexure or splenic flexure)                           K64.8, Other hemorrhoids CPT copyright 2016 American [employer] Association. All rights reserved. The codes documented in this report are preliminary and upon coder review may  be revised to meet current compliance requirements. Hildred Laser, MD Hildred Laser, MD 03/06/2016 12:16:44 PM This report has been signed electronically. Number of Addenda: 0

## 2016-03-14 ENCOUNTER — Encounter (HOSPITAL_COMMUNITY): Payer: Self-pay | Admitting: Internal Medicine

## 2016-03-14 ENCOUNTER — Other Ambulatory Visit: Payer: Self-pay

## 2016-03-14 DIAGNOSIS — N201 Calculus of ureter: Secondary | ICD-10-CM

## 2016-03-14 NOTE — Patient Instructions (Signed)
Kyle Santana  03/14/2016     @PREFPERIOPPHARMACY @   Your procedure is scheduled on  03/20/2016   Report to [facility] at  36  A.M.  Call this number if you have problems the morning of surgery:  [phone]   Remember:  Do not eat food or drink liquids after midnight.  Take these medicines the morning of surgery with A SIP OF WATER  lotensin.   Do not wear jewelry, make-up or nail polish.  Do not wear lotions, powders, or perfumes.  You may wear deoderant.  Do not shave 48 hours prior to surgery.  Men may shave face and neck.  Do not bring valuables to the hospital.  [facility] is not responsible for any belongings or valuables.  Contacts, dentures or bridgework may not be worn into surgery.  Leave your suitcase in the car.  After surgery it may be brought to your room.  For patients admitted to the hospital, discharge time will be determined by your treatment team.  Patients discharged the day of surgery will not be allowed to drive home.   Name and phone number of your driver:   family Special instructions:  none  Please read over the following fact sheets that you were given. Anesthesia Post-op Instructions and Care and Recovery After Surgery       Cystoscopy Cystoscopy is a procedure that is used to help your caregiver diagnose and sometimes treat conditions that affect your lower urinary tract. Your lower urinary tract includes your bladder and the tube through which urine passes from your bladder out of your body (urethra). Cystoscopy is performed with a thin, tube-shaped instrument (cystoscope). The cystoscope has lenses and a light at the end so that your caregiver can see inside your bladder. The cystoscope is inserted at the entrance of your urethra. Your caregiver guides it through your urethra and into your bladder. There are two main types of cystoscopy:  Flexible cystoscopy (with a flexible cystoscope).  Rigid cystoscopy (with a rigid  cystoscope). Cystoscopy may be recommended for many conditions, including:  Urinary tract infections.  Blood in your urine (hematuria).  Loss of bladder control (urinary incontinence) or overactive bladder.  Unusual cells found in a urine sample.  Urinary blockage.  Painful urination. Cystoscopy may also be done to remove a sample of your tissue to be checked under a microscope (biopsy). It may also be done to remove or destroy bladder stones. LET YOUR CAREGIVER KNOW ABOUT:  Allergies to food or medicine.  Medicines taken, including vitamins, herbs, eyedrops, over-the-counter medicines, and creams.  Use of steroids (by mouth or creams).  Previous problems with anesthetics or numbing medicines.  History of bleeding problems or blood clots.  Previous surgery.  Other health problems, including diabetes and kidney problems.  Possibility of pregnancy, if this applies. PROCEDURE The area around the opening to your urethra will be cleaned. A medicine to numb your urethra (local anesthetic) is used. If a tissue sample or stone is removed during the procedure, you may be given a medicine to make you sleep (general anesthetic). Your caregiver will gently insert the tip of the cystoscope into your urethra. The cystoscope will be slowly glided through your urethra and into your bladder. Sterile fluid will flow through the cystoscope and into your bladder. The fluid will expand and stretch your bladder. This gives your caregiver a better view of your bladder walls. The procedure lasts about 15-20 minutes. AFTER  THE PROCEDURE If a local anesthetic is used, you will be allowed to go home as soon as you are ready. If a general anesthetic is used, you will be taken to a recovery area until you are stable. You may have temporary bleeding and burning on urination.   This information is not intended to replace advice given to you by your health care provider. Make sure you discuss any questions you  have with your health care provider.   Document Released: 07/19/2000 Document Revised: 08/12/2014 Document Reviewed: 01/13/2012 Elsevier Interactive Patient Education 2016 [place]. Cystoscopy, Care After Refer to this sheet in the next few weeks. These instructions provide you with information on caring for yourself after your procedure. Your caregiver may also give you more specific instructions. Your treatment has been planned according to current medical practices, but problems sometimes occur. Call your caregiver if you have any problems or questions after your procedure. HOME CARE INSTRUCTIONS  Things you can do to ease any discomfort after your procedure include:  Drinking enough water and fluids to keep your urine clear or pale yellow.  Taking a warm bath to relieve any burning feelings. SEEK IMMEDIATE MEDICAL CARE IF:   You have an increase in blood in your urine.  You notice blood clots in your urine.  You have difficulty passing urine.  You have the chills.  You have abdominal pain.  You have a fever or persistent symptoms for more than 2-3 days.  You have a fever and your symptoms suddenly get worse. MAKE SURE YOU:   Understand these instructions.  Will watch your condition.  Will get help right away if you are not doing well or get worse.   This information is not intended to replace advice given to you by your health care provider. Make sure you discuss any questions you have with your health care provider.   Document Released: 02/08/2005 Document Revised: 08/12/2014 Document Reviewed: 01/13/2012 Elsevier Interactive Patient Education 2016 [employer] PATIENT INSTRUCTIONS POST-ANESTHESIA  IMMEDIATELY FOLLOWING SURGERY:  Do not drive or operate machinery for the first twenty four hours after surgery.  Do not make any important decisions for twenty four hours after surgery or while taking narcotic pain medications or sedatives.  If you develop intractable  nausea and vomiting or a severe headache please notify your doctor immediately.  FOLLOW-UP:  Please make an appointment with your surgeon as instructed. You do not need to follow up with anesthesia unless specifically instructed to do so.  WOUND CARE INSTRUCTIONS (if applicable):  Keep a dry clean dressing on the anesthesia/puncture wound site if there is drainage.  Once the wound has quit draining you may leave it open to air.  Generally you should leave the bandage intact for twenty four hours unless there is drainage.  If the epidural site drains for more than 36-48 hours please call the anesthesia department.  QUESTIONS?:  Please feel free to call your physician or the hospital operator if you have any questions, and they will be happy to assist you.

## 2016-03-15 ENCOUNTER — Encounter (HOSPITAL_COMMUNITY)
Admission: RE | Admit: 2016-03-15 | Discharge: 2016-03-15 | Disposition: A | Discharge status: Home / Self Care | Payer: Medicare Other | Source: Ambulatory Visit | Attending: Urology | Admitting: Urology

## 2016-03-15 ENCOUNTER — Encounter (HOSPITAL_COMMUNITY): Payer: Self-pay

## 2016-03-15 DIAGNOSIS — Z01810 Encounter for preprocedural cardiovascular examination: Secondary | ICD-10-CM | POA: Insufficient documentation

## 2016-03-15 DIAGNOSIS — Z01812 Encounter for preprocedural laboratory examination: Secondary | ICD-10-CM | POA: Diagnosis present

## 2016-03-15 LAB — CBC WITH DIFFERENTIAL/PLATELET
Basophils Absolute: 0 10*3/uL (ref 0.0–0.1)
Basophils Relative: 0 %
Eosinophils Absolute: 0.1 10*3/uL (ref 0.0–0.7)
Eosinophils Relative: 2 %
HCT: 37.8 % — ABNORMAL LOW (ref 39.0–52.0)
Hemoglobin: 12.9 g/dL — ABNORMAL LOW (ref 13.0–17.0)
Lymphocytes Relative: 24 %
Lymphs Abs: 1.3 10*3/uL (ref 0.7–4.0)
MCH: 35.9 pg — ABNORMAL HIGH (ref 26.0–34.0)
MCHC: 34.1 g/dL (ref 30.0–36.0)
MCV: 105.3 fL — ABNORMAL HIGH (ref 78.0–100.0)
Monocytes Absolute: 0.6 10*3/uL (ref 0.1–1.0)
Monocytes Relative: 12 %
Neutro Abs: 3.4 10*3/uL (ref 1.7–7.7)
Neutrophils Relative %: 62 %
Platelets: 122 10*3/uL — ABNORMAL LOW (ref 150–400)
RBC: 3.59 MIL/uL — ABNORMAL LOW (ref 4.22–5.81)
RDW: 13.5 % (ref 11.5–15.5)
WBC: 5.5 10*3/uL (ref 4.0–10.5)

## 2016-03-15 LAB — BASIC METABOLIC PANEL
Anion gap: 8 (ref 5–15)
BUN: 28 mg/dL — ABNORMAL HIGH (ref 6–20)
CO2: 27 mmol/L (ref 22–32)
Calcium: 9.3 mg/dL (ref 8.9–10.3)
Chloride: 104 mmol/L (ref 101–111)
Creatinine, Ser: 0.83 mg/dL (ref 0.61–1.24)
GFR calc Af Amer: >60 mL/min (ref >60)
GFR calc non Af Amer: >60 mL/min (ref >60)
Glucose, Bld: 95 mg/dL (ref 65–99)
Potassium: 5.3 mmol/L — ABNORMAL HIGH (ref 3.5–5.1)
Sodium: 139 mmol/L (ref 135–145)

## 2016-03-20 ENCOUNTER — Ambulatory Visit (HOSPITAL_COMMUNITY): Payer: Medicare Other

## 2016-03-20 ENCOUNTER — Ambulatory Visit (HOSPITAL_COMMUNITY)
Admission: RE | Admit: 2016-03-20 | Discharge: 2016-03-20 | Disposition: A | Discharge status: Home / Self Care | Payer: Medicare Other | Source: Ambulatory Visit | Attending: Urology | Admitting: Urology

## 2016-03-20 ENCOUNTER — Other Ambulatory Visit: Payer: Self-pay

## 2016-03-20 ENCOUNTER — Ambulatory Visit (HOSPITAL_COMMUNITY): Payer: Medicare Other | Admitting: Anesthesiology

## 2016-03-20 ENCOUNTER — Encounter (HOSPITAL_COMMUNITY)
Admission: RE | Disposition: A | Discharge status: Home / Self Care | Payer: Self-pay | Source: Ambulatory Visit | Attending: Urology

## 2016-03-20 ENCOUNTER — Encounter (HOSPITAL_COMMUNITY): Payer: Self-pay | Admitting: *Deleted

## 2016-03-20 DIAGNOSIS — Z7982 Long term (current) use of aspirin: Secondary | ICD-10-CM | POA: Insufficient documentation

## 2016-03-20 DIAGNOSIS — I10 Essential (primary) hypertension: Secondary | ICD-10-CM | POA: Insufficient documentation

## 2016-03-20 DIAGNOSIS — N201 Calculus of ureter: Secondary | ICD-10-CM

## 2016-03-20 DIAGNOSIS — Z79899 Other long term (current) drug therapy: Secondary | ICD-10-CM | POA: Diagnosis not present

## 2016-03-20 DIAGNOSIS — Z87442 Personal history of urinary calculi: Secondary | ICD-10-CM | POA: Diagnosis not present

## 2016-03-20 DIAGNOSIS — Z7984 Long term (current) use of oral hypoglycemic drugs: Secondary | ICD-10-CM | POA: Diagnosis not present

## 2016-03-20 DIAGNOSIS — N132 Hydronephrosis with renal and ureteral calculous obstruction: Secondary | ICD-10-CM | POA: Diagnosis not present

## 2016-03-20 DIAGNOSIS — E7800 Pure hypercholesterolemia, unspecified: Secondary | ICD-10-CM | POA: Diagnosis not present

## 2016-03-20 DIAGNOSIS — E119 Type 2 diabetes mellitus without complications: Secondary | ICD-10-CM | POA: Diagnosis not present

## 2016-03-20 DIAGNOSIS — N209 Urinary calculus, unspecified: Secondary | ICD-10-CM | POA: Diagnosis not present

## 2016-03-20 HISTORY — PX: CYSTOSCOPY WITH STENT PLACEMENT: SHX5790

## 2016-03-20 HISTORY — PX: CYSTOSCOPY/RETROGRADE/URETEROSCOPY/STONE EXTRACTION WITH BASKET: SHX5317

## 2016-03-20 LAB — GLUCOSE, CAPILLARY
Glucose-Capillary: 86 mg/dL (ref 65–99)
Glucose-Capillary: 148 mg/dL — ABNORMAL HIGH (ref 65–99)
Glucose-Capillary: 99 mg/dL (ref 65–99)

## 2016-03-20 SURGERY — CYSTOSCOPY, WITH CALCULUS REMOVAL USING BASKET
Anesthesia: Monitor Anesthesia Care | Laterality: Right

## 2016-03-20 MED ORDER — HYDROMORPHONE HCL 1 MG/ML IJ SOLN: 0.2500 mg | INTRAMUSCULAR | Status: DC | PRN

## 2016-03-20 MED ORDER — MIDAZOLAM HCL 5 MG/5ML IJ SOLN
INTRAMUSCULAR | Status: DC | PRN
  Administered 2016-03-20 (×2): .5 mg via INTRAVENOUS

## 2016-03-20 MED ORDER — TRAMADOL HCL 50 MG PO TABS: 50.0000 mg | ORAL_TABLET | Freq: Four times a day (QID) | ORAL | 0 refills | Status: DC | PRN

## 2016-03-20 MED ORDER — SODIUM CHLORIDE 0.9 % IR SOLN
Status: DC | PRN
  Administered 2016-03-20: 3000 mL

## 2016-03-20 MED ORDER — DIATRIZOATE MEGLUMINE 30 % UR SOLN
URETHRAL | Status: AC
  Filled 2016-03-20: qty 300

## 2016-03-20 MED ORDER — FENTANYL CITRATE (PF) 100 MCG/2ML IJ SOLN
INTRAMUSCULAR | Status: DC | PRN
  Administered 2016-03-20: 25 ug via INTRAVENOUS

## 2016-03-20 MED ORDER — LIDOCAINE HCL 2 % EX GEL
CUTANEOUS | Status: AC
  Filled 2016-03-20: qty 10

## 2016-03-20 MED ORDER — MIDAZOLAM HCL 2 MG/2ML IJ SOLN
1.0000 mg | INTRAMUSCULAR | Status: DC | PRN
Start: 2016-03-20 — End: 2016-06-10
  Administered 2016-03-20: 2 mg via INTRAVENOUS
  Filled 2016-03-20: qty 2

## 2016-03-20 MED ORDER — PHENYLEPHRINE 40 MCG/ML (10ML) SYRINGE FOR IV PUSH (FOR BLOOD PRESSURE SUPPORT)
PREFILLED_SYRINGE | INTRAVENOUS | Status: AC
  Filled 2016-03-20: qty 10

## 2016-03-20 MED ORDER — CHLORHEXIDINE GLUCONATE CLOTH 2 % EX PADS: 6.0000 | MEDICATED_PAD | Freq: Once | CUTANEOUS | Status: DC

## 2016-03-20 MED ORDER — FENTANYL CITRATE (PF) 100 MCG/2ML IJ SOLN
25.0000 ug | INTRAMUSCULAR | Status: DC | PRN
  Administered 2016-03-20: 25 ug via INTRAVENOUS
  Filled 2016-03-20: qty 2

## 2016-03-20 MED ORDER — SULFAMETHOXAZOLE-TRIMETHOPRIM 800-160 MG PO TABS: 1.0000 | ORAL_TABLET | Freq: Two times a day (BID) | ORAL | 0 refills | Status: DC

## 2016-03-20 MED ORDER — FENTANYL CITRATE (PF) 100 MCG/2ML IJ SOLN
INTRAMUSCULAR | Status: AC
  Filled 2016-03-20: qty 2

## 2016-03-20 MED ORDER — DEXTROSE 50 % IV SOLN
INTRAVENOUS | Status: AC
  Filled 2016-03-20: qty 50

## 2016-03-20 MED ORDER — PROPOFOL 10 MG/ML IV BOLUS
INTRAVENOUS | Status: AC
  Filled 2016-03-20: qty 20

## 2016-03-20 MED ORDER — LACTATED RINGERS IV SOLN
INTRAVENOUS | Status: DC
  Administered 2016-03-20: 13:00:00 via INTRAVENOUS

## 2016-03-20 MED ORDER — CEFAZOLIN IN D5W 1 GM/50ML IV SOLN
1.0000 g | INTRAVENOUS | Status: AC
  Administered 2016-03-20: 2 g via INTRAVENOUS
  Filled 2016-03-20: qty 50

## 2016-03-20 MED ORDER — MIDAZOLAM HCL 2 MG/2ML IJ SOLN
INTRAMUSCULAR | Status: AC
  Filled 2016-03-20: qty 2

## 2016-03-20 MED ORDER — LIDOCAINE HCL 1 % IJ SOLN
INTRAMUSCULAR | Status: DC | PRN
  Administered 2016-03-20: 10 mg via INTRADERMAL

## 2016-03-20 MED ORDER — DIATRIZOATE MEGLUMINE 30 % UR SOLN
URETHRAL | Status: DC | PRN
  Administered 2016-03-20: 10 mL via URETHRAL

## 2016-03-20 MED ORDER — FENTANYL CITRATE (PF) 250 MCG/5ML IJ SOLN
INTRAMUSCULAR | Status: AC
  Filled 2016-03-20: qty 5

## 2016-03-20 MED ORDER — PROPOFOL 10 MG/ML IV BOLUS
INTRAVENOUS | Status: DC | PRN
  Administered 2016-03-20: 10 mg via INTRAVENOUS

## 2016-03-20 MED ORDER — PROPOFOL 500 MG/50ML IV EMUL
INTRAVENOUS | Status: DC | PRN
  Administered 2016-03-20: 50 ug/kg/min via INTRAVENOUS

## 2016-03-20 MED ORDER — ONDANSETRON HCL 4 MG/2ML IJ SOLN
4.0000 mg | Freq: Once | INTRAMUSCULAR | Status: AC
  Administered 2016-03-20: 4 mg via INTRAVENOUS
  Filled 2016-03-20: qty 2

## 2016-03-20 MED ORDER — DEXTROSE 50 % IV SOLN
25.0000 mL | Freq: Once | INTRAVENOUS | Status: AC
  Administered 2016-03-20: 25 mL via INTRAVENOUS

## 2016-03-20 SURGICAL SUPPLY — 27 items
BAG DRAIN URO TABLE W/ADPT NS (DRAPE) ×2 IMPLANT
BAG DRN 8 ADPR NS SKTRN CSTL (DRAPE) ×1
BAG HAMPER (MISCELLANEOUS) ×2 IMPLANT
BASKET ZERO TIP NITINOL 2.4FR (BASKET) ×2 IMPLANT
BSKT STON RTRVL ZERO TP 2.4FR (BASKET) ×1
CATH INTERMIT  6FR 70CM (CATHETERS) IMPLANT
CLOTH BEACON ORANGE TIMEOUT ST (SAFETY) ×2 IMPLANT
EXTRACTOR STONE NITINOL NGAGE (UROLOGICAL SUPPLIES) ×2 IMPLANT
FIBER LASER FLEXIVA 200 (UROLOGICAL SUPPLIES) IMPLANT
FIBER LASER FLEXIVA 365 (UROLOGICAL SUPPLIES) IMPLANT
GLOVE BIO SURGEON STRL SZ7 (GLOVE) ×4 IMPLANT
GLOVE BIO SURGEON STRL SZ8 (GLOVE) ×2 IMPLANT
GLOVE BIOGEL PI IND STRL 7.0 (GLOVE) ×2 IMPLANT
GLOVE BIOGEL PI INDICATOR 7.0 (GLOVE) ×2
GOWN STRL REUS W/ TWL LRG LVL3 (GOWN DISPOSABLE) ×1 IMPLANT
GOWN STRL REUS W/ TWL XL LVL3 (GOWN DISPOSABLE) ×1 IMPLANT
GOWN STRL REUS W/TWL LRG LVL3 (GOWN DISPOSABLE) ×2
GOWN STRL REUS W/TWL XL LVL3 (GOWN DISPOSABLE) ×2
GUIDEWIRE ANG ZIPWIRE 038X150 (WIRE) ×2 IMPLANT
GUIDEWIRE STR DUAL SENSOR (WIRE) IMPLANT
GUIDEWIRE STR ZIPWIRE 035X150 (MISCELLANEOUS) ×2 IMPLANT
IV NS IRRIG 3000ML ARTHROMATIC (IV SOLUTION) ×4 IMPLANT
MANIFOLD NEPTUNE II (INSTRUMENTS) IMPLANT
PACK CYSTO (CUSTOM PROCEDURE TRAY) ×2 IMPLANT
PAD ARMBOARD 7.5X6 YLW CONV (MISCELLANEOUS) ×2 IMPLANT
STENT URET 6FRX26 CONTOUR (STENTS) ×2 IMPLANT
SYRINGE 10CC LL (SYRINGE) ×2 IMPLANT

## 2016-03-20 NOTE — Brief Op Note (Signed)
03/20/2016  2:02 PM  PATIENT:  Kyle Santana  82 y.o. male  PRE-OPERATIVE DIAGNOSIS:  right ureteral calculus  POST-OPERATIVE DIAGNOSIS:  * No post-op diagnosis entered *  PROCEDURE:  Cystoscopy, right retrograde pyelogram, right basket stone extraction, right ureteral stent placement SURGEON:  Surgeon(s) and Role:    * Cleon Gustin, MD - Primary  PHYSICIAN ASSISTANT:   ASSISTANTS: none   ANESTHESIA:   MAC  EBL:  Total I/O In: 800 [I.V.:800] Out: -   BLOOD ADMINISTERED:none  DRAINS: right 6x26 JJ ureteral stent with tehter  LOCAL MEDICATIONS USED:  NONE  SPECIMEN:  Source of Specimen:  right ureteral calculus  DISPOSITION OF SPECIMEN:  PATHOLOGY  COUNTS:  YES  TOURNIQUET:  * No tourniquets in log *  DICTATION: .Note written in EPIC  PLAN OF CARE: Discharge to home after PACU  PATIENT DISPOSITION:  PACU - hemodynamically stable.   Delay start of Pharmacological VTE agent (>24hrs) due to surgical blood loss or risk of bleeding: not applicable

## 2016-03-20 NOTE — Anesthesia Postprocedure Evaluation (Signed)
Anesthesia Post Note  Patient: Kyle Santana  Procedure(s) Performed: Procedure(s) (LRB): CYSTOSCOPY/RETROGRADE/STONE EXTRACTION WITH BASKET (Right) CYSTOSCOPY WITH STENT PLACEMENT (Right)  Patient location during evaluation: PACU Anesthesia Type: MAC Level of consciousness: awake and alert and oriented Pain management: pain level controlled Vital Signs Assessment: post-procedure vital signs reviewed and stable Respiratory status: spontaneous breathing Cardiovascular status: stable Postop Assessment: no signs of nausea or vomiting Anesthetic complications: no    Last Vitals:  Vitals:   03/20/16 1325 03/20/16 1415  BP:    Pulse:  86  Resp: 14 13  Temp:  36.8 C    Last Pain:  Vitals:   03/20/16 1054  TempSrc: Oral                 Gustie Bobb A

## 2016-03-20 NOTE — H&P (Signed)
Urology Admission H&P  Chief Complaint: right flank pain  History of Present Illness: Mr Umland is a 82yo with a hx of nephrolithiasis who has a right ureteral calculus and has failed MET. He has intermittent right flank pain. No worsening LUTS  Past Medical History:  Diagnosis Date  . Colon polyps   . Diabetes mellitus without complication ([place])   . Hypercholesteremia   . Hypertension    Past Surgical History:  Procedure Laterality Date  . COLONOSCOPY    . COLONOSCOPY N/A 03/06/2016   Procedure: COLONOSCOPY;  Surgeon: Rogene Houston, MD;  Location: [facility] SUITE;  Service: Endoscopy;  Laterality: N/A;  1200  . [facility]  . Left inguinal hernia repair      Home Medications:  Prescriptions Prior to Admission  Medication Sig Dispense Refill Last Dose  . aspirin EC 81 MG tablet Take 1 tablet (81 mg total) by mouth daily.   03/16/2016  . benazepril (LOTENSIN) 5 MG tablet Take 1 tablet by mouth daily.   03/20/2016 at 0700  . calcium carbonate (OSCAL) 1500 (600 Ca) MG TABS tablet Take 600 mg of elemental calcium by mouth 2 (two) times daily with a meal.   03/19/2016 at Unknown time  . metFORMIN (GLUCOPHAGE) 1000 MG tablet Take 1 tablet by mouth 2 (two) times daily.   03/19/2016 at Unknown time  . Multiple Vitamins-Minerals (EYE VITAMINS PO) Take 1 tablet by mouth daily.   03/19/2016 at Unknown time  . simvastatin (ZOCOR) 40 MG tablet Take 1 tablet by mouth daily.   03/19/2016 at Unknown time   Allergies: No Known Allergies  History reviewed. No pertinent family history. Social History:  reports that he has never smoked. He has never used smokeless tobacco. He reports that he does not drink alcohol or use drugs.  Review of Systems  All other systems reviewed and are negative.   Physical Exam:  Vital signs in last 24 hours: Temp:  [98 F (36.7 C)] 98 F (36.7 C) (08/16 1054) Resp:  [10-28] 10 (08/16 1200) BP: (98-107)/(71-80) 102/72 (08/16  1200) SpO2:  [100 %] 100 % (08/16 1200) Physical Exam  Constitutional: He is oriented to person, place, and time. He appears well-developed and well-nourished.  HENT:  Head: Normocephalic and atraumatic.  Eyes: EOM are normal. Pupils are equal, round, and reactive to light.  Neck: Normal range of motion. No thyromegaly present.  Cardiovascular: Normal rate and regular rhythm.   Respiratory: Effort normal. No respiratory distress.  GI: Soft. He exhibits no distension.  Musculoskeletal: Normal range of motion.  Neurological: He is alert and oriented to person, place, and time.  Skin: Skin is warm and dry.  Psychiatric: He has a normal mood and affect. His behavior is normal. Judgment and thought content normal.    Laboratory Data:  Results for orders placed or performed during the hospital encounter of 03/20/16 (from the past 24 hour(s))  Glucose, capillary     Status: None   Collection Time: 03/20/16 11:15 AM  Result Value Ref Range   Glucose-Capillary 86 65 - 99 mg/dL   No results found for this or any previous visit (from the past 240 hour(s)). Creatinine:  Recent Labs  03/15/16 1430  CREATININE 0.83   Baseline Creatinine: 0.8  Impression/Assessment:  82yo with right ureteral calculus  Plan:  The risks/benefits/alternatives to right ureteroscopic stone extraction was explained to the patient and he understands and wishes to proceed with surgery  Saralyn Pilar  Romyn Boswell 03/20/2016, 12:28 PM

## 2016-03-20 NOTE — Anesthesia Preprocedure Evaluation (Addendum)
Anesthesia Evaluation  Patient identified by MRN, date of birth, ID band Patient awake    Reviewed: Allergy & Precautions, NPO status , Patient's Chart, lab work & pertinent test results  History of Anesthesia Complications (+) history of anesthetic complications (hx "throat pain" after [facility] at [facility])  Airway Mallampati: III  TM Distance: >3 FB Neck ROM: Limited    Dental  (+) Edentulous Upper, Edentulous Lower   Pulmonary neg pulmonary ROS,    breath sounds clear to auscultation       Cardiovascular hypertension, Pt. on medications  Rhythm:Regular Rate:Normal     Neuro/Psych negative neurological ROS     GI/Hepatic negative GI ROS,   Endo/Other  diabetes, Well Controlled, Type 2, Oral Hypoglycemic Agents  Renal/GU      Musculoskeletal   Abdominal   Peds  Hematology   Anesthesia Other Findings   Reproductive/Obstetrics                             Anesthesia Physical Anesthesia Plan  ASA: II  Anesthesia Plan: MAC   Post-op Pain Management:    Induction: Intravenous  Airway Management Planned: Simple Face Mask  Additional Equipment:   Intra-op Plan:   Post-operative Plan:   Informed Consent: I have reviewed the patients History and Physical, chart, labs and discussed the procedure including the risks, benefits and alternatives for the proposed anesthesia with the patient or authorized representative who has indicated his/her understanding and acceptance.     Plan Discussed with: Surgeon and CRNA  Anesthesia Plan Comments: (Possible Gen - LMA if needed.)       Anesthesia Quick Evaluation

## 2016-03-20 NOTE — Transfer of Care (Signed)
Immediate Anesthesia Transfer of Care Note  Patient: Kyle Santana  Procedure(s) Performed: Procedure(s): CYSTOSCOPY/RETROGRADE/STONE EXTRACTION WITH BASKET (Right) CYSTOSCOPY WITH STENT PLACEMENT (Right) HOLMIUM LASER APPLICATION (Right)  Patient Location: PACU  Anesthesia Type:MAC  Level of Consciousness: awake, oriented and patient cooperative  Airway & Oxygen Therapy: Patient Spontanous Breathing and Patient connected to face mask oxygen  Post-op Assessment: Report given to RN and Post -op Vital signs reviewed and stable  Post vital signs: Reviewed and stable  Last Vitals:  Vitals:   03/20/16 1320 03/20/16 1325  BP: 100/73   Resp: 14 14  Temp:      Last Pain:  Vitals:   03/20/16 1054  TempSrc: Oral      Patients Stated Pain Goal: 6 ([id number])  Complications: No apparent anesthesia complications

## 2016-03-20 NOTE — Discharge Instructions (Signed)
ATIENT INSTRUCTIONSQAZ zws-* POST-ANESTHESIA  IMMEDIATELY FOLLOWING SURGERY:  Do not drive or operate machinery for the first twenty four hours after surgery.  Do not make any important decisions for twenty four hours after surgery or while taking narcotic pain medications or sedatives.  If you develop intractable nausea and vomiting or a severe headache please notify your doctor immediately.  FOLLOW-UP:  Please make an appointment with your surgeon as instructed. You do not need to follow up with anesthesia unless specifically instructed to do so.  WOUND CARE INSTRUCTIONS (if applicable):  Keep a dry clean dressing on the anesthesia/puncture wound site if there is drainage.  Once the wound has quit draining you may leave it open to air.  Generally you should leave the bandage intact for twenty four hours unless there is drainage.  If the epidural site drains for more than 36-48 hours please call the anesthesia department.  QUESTIONS?:  Please feel free to call your physician or the hospital operator if you have any questions, and they will be happy to assist you.

## 2016-03-21 DIAGNOSIS — N132 Hydronephrosis with renal and ureteral calculous obstruction: Secondary | ICD-10-CM | POA: Diagnosis not present

## 2016-03-21 DIAGNOSIS — E119 Type 2 diabetes mellitus without complications: Secondary | ICD-10-CM | POA: Diagnosis not present

## 2016-03-21 DIAGNOSIS — Z7984 Long term (current) use of oral hypoglycemic drugs: Secondary | ICD-10-CM | POA: Diagnosis not present

## 2016-03-21 DIAGNOSIS — Z87442 Personal history of urinary calculi: Secondary | ICD-10-CM | POA: Diagnosis not present

## 2016-03-21 DIAGNOSIS — E7800 Pure hypercholesterolemia, unspecified: Secondary | ICD-10-CM | POA: Diagnosis not present

## 2016-03-21 DIAGNOSIS — Z79899 Other long term (current) drug therapy: Secondary | ICD-10-CM | POA: Diagnosis not present

## 2016-03-21 DIAGNOSIS — I10 Essential (primary) hypertension: Secondary | ICD-10-CM | POA: Diagnosis not present

## 2016-03-21 DIAGNOSIS — Z7982 Long term (current) use of aspirin: Secondary | ICD-10-CM | POA: Diagnosis not present

## 2016-03-21 NOTE — Op Note (Signed)
Preoperative diagnosis: Right ureteral stone  Postoperative diagnosis: Same  Procedure: 1 cystoscopy 2 right retrograde pyelography 3.  Intraoperative fluoroscopy, under one hour, with interpretation 4.  Right ureteroscopic stone manipulation with basket extraction 5.  Right 6 x 26 JJ stent placement  Attending: Rosie Fate  Anesthesia: MAC  Estimated blood loss: None  Drains: Right 6 x 26 JJ ureteral stent with tether  Specimens: stone for analysis  Antibiotics: ancef  Findings: right distal ureteral calculus. Mild hydroureteronephrosis.  Indications: Patient is a 82 year old male/male with a history of ureteral stone and who has failed medical expulsive therapy.  After discussing treatment options, he decided proceed with right ureteroscopic stone manipulation.  Procedure her in detail: The patient was brought to the operating room and a brief timeout was done to ensure correct patient, correct procedure, correct site.  General anesthesia was administered patient was placed in dorsal lithotomy position.  Her genitalia was then prepped and draped in usual sterile fashion.  A rigid 73 French cystoscope was passed in the urethra and the bladder.  Bladder was inspected free masses or lesions.  the right ureteral orifices were in the normal orthotopic locations.  a 6 french ureteral catheter was then instilled into the right ureter orifice.  a gentle retrograde was obtained and findings noted above.  we then placed a zip wire through the ureteral catheter and advanced up to the renal pelvis.  we then removed the cystoscope and cannulated the right ureteral orifice with a semirigid ureteroscope.  we then encountered the stone in the mid ureter.  The stone was removed with a Ngage basket.  We then placed and good coil was noted in the the renal pelvis under fluoroscopy and the bladder under direct vision.   once all stone fragments were removed we then placed a 6 x 26 double-j ureteral  stent over the original zip wire.  the stone fragments were then removed from the bladder and sent for analysis.   the bladder was then drained and this concluded the procedure which was well tolerated by patient.  Complications: None  Condition: Stable, extubated, transferred to PACU  Plan: Patient is to be discharged home as to follow-up in one week. He is to remove his stent in 72 hours by pulling the tether

## 2016-03-25 ENCOUNTER — Encounter (HOSPITAL_COMMUNITY): Payer: Self-pay | Admitting: Urology

## 2016-03-29 LAB — STONE ANALYSIS
Ca Oxalate,Dihydrate: 55 %
Ca Oxalate,Monohydr.: 30 %
Ca phos cry stone ql IR: 15 %
Stone Weight KSTONE: 42 mg

## 2016-04-17 ENCOUNTER — Ambulatory Visit (INDEPENDENT_AMBULATORY_CARE_PROVIDER_SITE_OTHER): Payer: Medicare Other | Admitting: Urology

## 2016-04-17 DIAGNOSIS — N201 Calculus of ureter: Secondary | ICD-10-CM | POA: Diagnosis not present

## 2016-05-09 DIAGNOSIS — E1142 Type 2 diabetes mellitus with diabetic polyneuropathy: Secondary | ICD-10-CM | POA: Diagnosis not present

## 2016-05-09 DIAGNOSIS — E784 Other hyperlipidemia: Secondary | ICD-10-CM | POA: Diagnosis not present

## 2016-05-09 DIAGNOSIS — I10 Essential (primary) hypertension: Secondary | ICD-10-CM | POA: Diagnosis not present

## 2016-05-09 DIAGNOSIS — Z1389 Encounter for screening for other disorder: Secondary | ICD-10-CM | POA: Diagnosis not present

## 2016-05-09 DIAGNOSIS — Z0000 Encounter for general adult medical examination without abnormal findings: Secondary | ICD-10-CM | POA: Diagnosis not present

## 2016-05-17 ENCOUNTER — Ambulatory Visit (HOSPITAL_COMMUNITY)
Admission: RE | Admit: 2016-05-17 | Discharge: 2016-05-17 | Disposition: A | Discharge status: Home / Self Care | Payer: Medicare Other | Source: Ambulatory Visit | Attending: Urology | Admitting: Urology

## 2016-05-17 DIAGNOSIS — N200 Calculus of kidney: Secondary | ICD-10-CM | POA: Insufficient documentation

## 2016-05-17 DIAGNOSIS — N201 Calculus of ureter: Secondary | ICD-10-CM | POA: Diagnosis present

## 2016-05-17 DIAGNOSIS — N281 Cyst of kidney, acquired: Secondary | ICD-10-CM | POA: Insufficient documentation

## 2016-05-17 DIAGNOSIS — N202 Calculus of kidney with calculus of ureter: Secondary | ICD-10-CM | POA: Diagnosis not present

## 2016-06-26 ENCOUNTER — Ambulatory Visit (INDEPENDENT_AMBULATORY_CARE_PROVIDER_SITE_OTHER): Payer: Medicare Other | Admitting: Urology

## 2016-06-26 DIAGNOSIS — N200 Calculus of kidney: Secondary | ICD-10-CM

## 2016-08-15 DIAGNOSIS — I10 Essential (primary) hypertension: Secondary | ICD-10-CM | POA: Diagnosis not present

## 2016-08-15 DIAGNOSIS — Z6820 Body mass index (BMI) 20.0-20.9, adult: Secondary | ICD-10-CM | POA: Diagnosis not present

## 2016-08-15 DIAGNOSIS — E1142 Type 2 diabetes mellitus with diabetic polyneuropathy: Secondary | ICD-10-CM | POA: Diagnosis not present

## 2016-08-15 DIAGNOSIS — E784 Other hyperlipidemia: Secondary | ICD-10-CM | POA: Diagnosis not present

## 2016-11-15 DIAGNOSIS — Z6820 Body mass index (BMI) 20.0-20.9, adult: Secondary | ICD-10-CM | POA: Diagnosis not present

## 2016-11-15 DIAGNOSIS — E784 Other hyperlipidemia: Secondary | ICD-10-CM | POA: Diagnosis not present

## 2016-11-15 DIAGNOSIS — I10 Essential (primary) hypertension: Secondary | ICD-10-CM | POA: Diagnosis not present

## 2016-11-15 DIAGNOSIS — E1142 Type 2 diabetes mellitus with diabetic polyneuropathy: Secondary | ICD-10-CM | POA: Diagnosis not present

## 2016-11-15 DIAGNOSIS — G5602 Carpal tunnel syndrome, left upper limb: Secondary | ICD-10-CM | POA: Diagnosis not present

## 2016-11-22 ENCOUNTER — Other Ambulatory Visit: Payer: Self-pay | Admitting: Urology

## 2016-11-22 DIAGNOSIS — N200 Calculus of kidney: Secondary | ICD-10-CM

## 2016-11-23 DIAGNOSIS — Z23 Encounter for immunization: Secondary | ICD-10-CM | POA: Diagnosis not present

## 2016-11-23 DIAGNOSIS — S6991XA Unspecified injury of right wrist, hand and finger(s), initial encounter: Secondary | ICD-10-CM | POA: Diagnosis not present

## 2016-11-23 DIAGNOSIS — S61412A Laceration without foreign body of left hand, initial encounter: Secondary | ICD-10-CM | POA: Diagnosis not present

## 2016-11-23 DIAGNOSIS — Z79899 Other long term (current) drug therapy: Secondary | ICD-10-CM | POA: Diagnosis not present

## 2016-11-23 DIAGNOSIS — S61411A Laceration without foreign body of right hand, initial encounter: Secondary | ICD-10-CM | POA: Diagnosis not present

## 2016-11-23 DIAGNOSIS — W1839XA Other fall on same level, initial encounter: Secondary | ICD-10-CM | POA: Diagnosis not present

## 2016-11-23 DIAGNOSIS — E119 Type 2 diabetes mellitus without complications: Secondary | ICD-10-CM | POA: Diagnosis not present

## 2016-11-23 DIAGNOSIS — E7800 Pure hypercholesterolemia, unspecified: Secondary | ICD-10-CM | POA: Diagnosis not present

## 2016-11-23 DIAGNOSIS — S80211A Abrasion, right knee, initial encounter: Secondary | ICD-10-CM | POA: Diagnosis not present

## 2016-11-23 DIAGNOSIS — Z7984 Long term (current) use of oral hypoglycemic drugs: Secondary | ICD-10-CM | POA: Diagnosis not present

## 2016-11-29 DIAGNOSIS — Z0000 Encounter for general adult medical examination without abnormal findings: Secondary | ICD-10-CM | POA: Diagnosis not present

## 2016-11-29 DIAGNOSIS — S20221A Contusion of right back wall of thorax, initial encounter: Secondary | ICD-10-CM | POA: Diagnosis not present

## 2016-11-29 DIAGNOSIS — Z1389 Encounter for screening for other disorder: Secondary | ICD-10-CM | POA: Diagnosis not present

## 2016-11-29 DIAGNOSIS — Z6820 Body mass index (BMI) 20.0-20.9, adult: Secondary | ICD-10-CM | POA: Diagnosis not present

## 2016-12-17 DIAGNOSIS — R601 Generalized edema: Secondary | ICD-10-CM | POA: Diagnosis not present

## 2016-12-17 DIAGNOSIS — R0602 Shortness of breath: Secondary | ICD-10-CM | POA: Diagnosis not present

## 2016-12-23 ENCOUNTER — Ambulatory Visit (HOSPITAL_COMMUNITY)
Admission: RE | Admit: 2016-12-23 | Discharge: 2016-12-23 | Disposition: A | Discharge status: Home / Self Care | Payer: Medicare Other | Source: Ambulatory Visit | Attending: Urology | Admitting: Urology

## 2016-12-23 DIAGNOSIS — N200 Calculus of kidney: Secondary | ICD-10-CM | POA: Insufficient documentation

## 2016-12-23 DIAGNOSIS — N281 Cyst of kidney, acquired: Secondary | ICD-10-CM | POA: Diagnosis not present

## 2017-01-01 ENCOUNTER — Ambulatory Visit (INDEPENDENT_AMBULATORY_CARE_PROVIDER_SITE_OTHER): Payer: Medicare Other | Admitting: Urology

## 2017-01-01 DIAGNOSIS — N200 Calculus of kidney: Secondary | ICD-10-CM | POA: Diagnosis not present

## 2017-01-30 DIAGNOSIS — H43393 Other vitreous opacities, bilateral: Secondary | ICD-10-CM | POA: Diagnosis not present

## 2017-01-30 DIAGNOSIS — H524 Presbyopia: Secondary | ICD-10-CM | POA: Diagnosis not present

## 2017-02-17 DIAGNOSIS — I10 Essential (primary) hypertension: Secondary | ICD-10-CM | POA: Diagnosis not present

## 2017-02-17 DIAGNOSIS — R601 Generalized edema: Secondary | ICD-10-CM | POA: Diagnosis not present

## 2017-02-17 DIAGNOSIS — Z0000 Encounter for general adult medical examination without abnormal findings: Secondary | ICD-10-CM | POA: Diagnosis not present

## 2017-02-17 DIAGNOSIS — Z6822 Body mass index (BMI) 22.0-22.9, adult: Secondary | ICD-10-CM | POA: Diagnosis not present

## 2017-02-17 DIAGNOSIS — E784 Other hyperlipidemia: Secondary | ICD-10-CM | POA: Diagnosis not present

## 2017-02-17 DIAGNOSIS — E1142 Type 2 diabetes mellitus with diabetic polyneuropathy: Secondary | ICD-10-CM | POA: Diagnosis not present

## 2017-03-04 DIAGNOSIS — I471 Supraventricular tachycardia: Secondary | ICD-10-CM | POA: Diagnosis not present

## 2017-03-04 DIAGNOSIS — R531 Weakness: Secondary | ICD-10-CM | POA: Diagnosis not present

## 2017-03-04 DIAGNOSIS — R079 Chest pain, unspecified: Secondary | ICD-10-CM | POA: Diagnosis not present

## 2017-03-04 DIAGNOSIS — R0602 Shortness of breath: Secondary | ICD-10-CM | POA: Diagnosis not present

## 2017-03-04 DIAGNOSIS — R0789 Other chest pain: Secondary | ICD-10-CM | POA: Diagnosis not present

## 2017-03-05 DIAGNOSIS — I471 Supraventricular tachycardia: Secondary | ICD-10-CM | POA: Diagnosis not present

## 2017-03-05 DIAGNOSIS — R0789 Other chest pain: Secondary | ICD-10-CM | POA: Diagnosis not present

## 2017-03-14 DIAGNOSIS — Z6823 Body mass index (BMI) 23.0-23.9, adult: Secondary | ICD-10-CM | POA: Diagnosis not present

## 2017-03-14 DIAGNOSIS — I208 Other forms of angina pectoris: Secondary | ICD-10-CM | POA: Diagnosis not present

## 2017-05-08 DIAGNOSIS — J209 Acute bronchitis, unspecified: Secondary | ICD-10-CM | POA: Diagnosis not present

## 2017-05-08 DIAGNOSIS — J069 Acute upper respiratory infection, unspecified: Secondary | ICD-10-CM | POA: Diagnosis not present

## 2017-05-08 DIAGNOSIS — R05 Cough: Secondary | ICD-10-CM | POA: Diagnosis not present

## 2017-05-17 DIAGNOSIS — J40 Bronchitis, not specified as acute or chronic: Secondary | ICD-10-CM | POA: Diagnosis not present

## 2017-05-17 DIAGNOSIS — R05 Cough: Secondary | ICD-10-CM | POA: Diagnosis not present

## 2017-05-21 DIAGNOSIS — J40 Bronchitis, not specified as acute or chronic: Secondary | ICD-10-CM | POA: Diagnosis not present

## 2017-06-09 DIAGNOSIS — I5041 Acute combined systolic (congestive) and diastolic (congestive) heart failure: Secondary | ICD-10-CM | POA: Diagnosis not present

## 2017-06-09 DIAGNOSIS — E7849 Other hyperlipidemia: Secondary | ICD-10-CM | POA: Diagnosis not present

## 2017-06-09 DIAGNOSIS — E7800 Pure hypercholesterolemia, unspecified: Secondary | ICD-10-CM | POA: Diagnosis not present

## 2017-06-09 DIAGNOSIS — I6523 Occlusion and stenosis of bilateral carotid arteries: Secondary | ICD-10-CM | POA: Diagnosis not present

## 2017-06-09 DIAGNOSIS — E1151 Type 2 diabetes mellitus with diabetic peripheral angiopathy without gangrene: Secondary | ICD-10-CM | POA: Diagnosis not present

## 2017-06-09 DIAGNOSIS — R05 Cough: Secondary | ICD-10-CM | POA: Diagnosis not present

## 2017-06-09 DIAGNOSIS — R000 Tachycardia, unspecified: Secondary | ICD-10-CM | POA: Diagnosis not present

## 2017-06-09 DIAGNOSIS — I270 Primary pulmonary hypertension: Secondary | ICD-10-CM | POA: Diagnosis not present

## 2017-06-09 DIAGNOSIS — I509 Heart failure, unspecified: Secondary | ICD-10-CM | POA: Diagnosis not present

## 2017-06-09 DIAGNOSIS — Z7902 Long term (current) use of antithrombotics/antiplatelets: Secondary | ICD-10-CM | POA: Diagnosis not present

## 2017-06-09 DIAGNOSIS — I272 Pulmonary hypertension, unspecified: Secondary | ICD-10-CM | POA: Diagnosis not present

## 2017-06-09 DIAGNOSIS — Z79899 Other long term (current) drug therapy: Secondary | ICD-10-CM | POA: Diagnosis not present

## 2017-06-09 DIAGNOSIS — E785 Hyperlipidemia, unspecified: Secondary | ICD-10-CM | POA: Diagnosis not present

## 2017-06-19 DIAGNOSIS — J209 Acute bronchitis, unspecified: Secondary | ICD-10-CM | POA: Diagnosis not present

## 2017-06-19 DIAGNOSIS — I509 Heart failure, unspecified: Secondary | ICD-10-CM | POA: Diagnosis not present

## 2017-06-19 DIAGNOSIS — R0602 Shortness of breath: Secondary | ICD-10-CM | POA: Diagnosis not present

## 2017-06-19 DIAGNOSIS — M810 Age-related osteoporosis without current pathological fracture: Secondary | ICD-10-CM | POA: Diagnosis not present

## 2017-06-23 DIAGNOSIS — Z6821 Body mass index (BMI) 21.0-21.9, adult: Secondary | ICD-10-CM | POA: Diagnosis not present

## 2017-06-23 DIAGNOSIS — I5032 Chronic diastolic (congestive) heart failure: Secondary | ICD-10-CM | POA: Diagnosis not present

## 2017-06-27 DIAGNOSIS — N179 Acute kidney failure, unspecified: Secondary | ICD-10-CM | POA: Diagnosis not present

## 2017-06-27 DIAGNOSIS — E785 Hyperlipidemia, unspecified: Secondary | ICD-10-CM | POA: Diagnosis not present

## 2017-06-27 DIAGNOSIS — I517 Cardiomegaly: Secondary | ICD-10-CM | POA: Diagnosis not present

## 2017-06-27 DIAGNOSIS — A4189 Other specified sepsis: Secondary | ICD-10-CM | POA: Diagnosis not present

## 2017-06-27 DIAGNOSIS — R079 Chest pain, unspecified: Secondary | ICD-10-CM | POA: Diagnosis not present

## 2017-06-27 DIAGNOSIS — E119 Type 2 diabetes mellitus without complications: Secondary | ICD-10-CM | POA: Diagnosis not present

## 2017-06-27 DIAGNOSIS — R51 Headache: Secondary | ICD-10-CM | POA: Diagnosis not present

## 2017-06-27 DIAGNOSIS — I110 Hypertensive heart disease with heart failure: Secondary | ICD-10-CM | POA: Diagnosis not present

## 2017-06-27 DIAGNOSIS — Z7902 Long term (current) use of antithrombotics/antiplatelets: Secondary | ICD-10-CM | POA: Diagnosis not present

## 2017-06-27 DIAGNOSIS — I959 Hypotension, unspecified: Secondary | ICD-10-CM | POA: Diagnosis not present

## 2017-06-27 DIAGNOSIS — E860 Dehydration: Secondary | ICD-10-CM | POA: Diagnosis not present

## 2017-06-27 DIAGNOSIS — R6521 Severe sepsis with septic shock: Secondary | ICD-10-CM | POA: Diagnosis not present

## 2017-06-27 DIAGNOSIS — I5021 Acute systolic (congestive) heart failure: Secondary | ICD-10-CM | POA: Diagnosis not present

## 2017-06-27 DIAGNOSIS — I5042 Chronic combined systolic (congestive) and diastolic (congestive) heart failure: Secondary | ICD-10-CM | POA: Diagnosis not present

## 2017-06-27 DIAGNOSIS — I270 Primary pulmonary hypertension: Secondary | ICD-10-CM | POA: Diagnosis not present

## 2017-06-27 DIAGNOSIS — E7800 Pure hypercholesterolemia, unspecified: Secondary | ICD-10-CM | POA: Diagnosis not present

## 2017-06-27 DIAGNOSIS — I739 Peripheral vascular disease, unspecified: Secondary | ICD-10-CM | POA: Diagnosis not present

## 2017-06-27 DIAGNOSIS — Z79899 Other long term (current) drug therapy: Secondary | ICD-10-CM | POA: Diagnosis not present

## 2017-06-27 DIAGNOSIS — I272 Pulmonary hypertension, unspecified: Secondary | ICD-10-CM | POA: Diagnosis not present

## 2017-06-27 DIAGNOSIS — A419 Sepsis, unspecified organism: Secondary | ICD-10-CM | POA: Diagnosis not present

## 2017-06-27 DIAGNOSIS — R0602 Shortness of breath: Secondary | ICD-10-CM | POA: Diagnosis not present

## 2017-07-02 DIAGNOSIS — I5040 Unspecified combined systolic (congestive) and diastolic (congestive) heart failure: Secondary | ICD-10-CM | POA: Diagnosis not present

## 2017-07-02 DIAGNOSIS — Z7951 Long term (current) use of inhaled steroids: Secondary | ICD-10-CM | POA: Diagnosis not present

## 2017-07-02 DIAGNOSIS — I272 Pulmonary hypertension, unspecified: Secondary | ICD-10-CM | POA: Diagnosis not present

## 2017-07-02 DIAGNOSIS — I739 Peripheral vascular disease, unspecified: Secondary | ICD-10-CM | POA: Diagnosis not present

## 2017-07-02 DIAGNOSIS — Z7902 Long term (current) use of antithrombotics/antiplatelets: Secondary | ICD-10-CM | POA: Diagnosis not present

## 2017-07-02 DIAGNOSIS — E785 Hyperlipidemia, unspecified: Secondary | ICD-10-CM | POA: Diagnosis not present

## 2017-07-02 DIAGNOSIS — I7389 Other specified peripheral vascular diseases: Secondary | ICD-10-CM | POA: Diagnosis not present

## 2017-07-02 DIAGNOSIS — I270 Primary pulmonary hypertension: Secondary | ICD-10-CM | POA: Diagnosis not present

## 2017-07-02 DIAGNOSIS — Z7984 Long term (current) use of oral hypoglycemic drugs: Secondary | ICD-10-CM | POA: Diagnosis not present

## 2017-07-02 DIAGNOSIS — I5042 Chronic combined systolic (congestive) and diastolic (congestive) heart failure: Secondary | ICD-10-CM | POA: Diagnosis not present

## 2017-07-02 DIAGNOSIS — A419 Sepsis, unspecified organism: Secondary | ICD-10-CM | POA: Diagnosis not present

## 2017-07-03 DIAGNOSIS — A419 Sepsis, unspecified organism: Secondary | ICD-10-CM | POA: Diagnosis not present

## 2017-07-03 DIAGNOSIS — Z7902 Long term (current) use of antithrombotics/antiplatelets: Secondary | ICD-10-CM | POA: Diagnosis not present

## 2017-07-03 DIAGNOSIS — E785 Hyperlipidemia, unspecified: Secondary | ICD-10-CM | POA: Diagnosis not present

## 2017-07-03 DIAGNOSIS — Z7951 Long term (current) use of inhaled steroids: Secondary | ICD-10-CM | POA: Diagnosis not present

## 2017-07-03 DIAGNOSIS — I739 Peripheral vascular disease, unspecified: Secondary | ICD-10-CM | POA: Diagnosis not present

## 2017-07-03 DIAGNOSIS — I5040 Unspecified combined systolic (congestive) and diastolic (congestive) heart failure: Secondary | ICD-10-CM | POA: Diagnosis not present

## 2017-07-03 DIAGNOSIS — I272 Pulmonary hypertension, unspecified: Secondary | ICD-10-CM | POA: Diagnosis not present

## 2017-07-03 DIAGNOSIS — Z7984 Long term (current) use of oral hypoglycemic drugs: Secondary | ICD-10-CM | POA: Diagnosis not present

## 2017-07-08 DIAGNOSIS — Z7951 Long term (current) use of inhaled steroids: Secondary | ICD-10-CM | POA: Diagnosis not present

## 2017-07-08 DIAGNOSIS — Z6822 Body mass index (BMI) 22.0-22.9, adult: Secondary | ICD-10-CM | POA: Diagnosis not present

## 2017-07-08 DIAGNOSIS — I5040 Unspecified combined systolic (congestive) and diastolic (congestive) heart failure: Secondary | ICD-10-CM | POA: Diagnosis not present

## 2017-07-08 DIAGNOSIS — E785 Hyperlipidemia, unspecified: Secondary | ICD-10-CM | POA: Diagnosis not present

## 2017-07-08 DIAGNOSIS — A419 Sepsis, unspecified organism: Secondary | ICD-10-CM | POA: Diagnosis not present

## 2017-07-08 DIAGNOSIS — I272 Pulmonary hypertension, unspecified: Secondary | ICD-10-CM | POA: Diagnosis not present

## 2017-07-08 DIAGNOSIS — I739 Peripheral vascular disease, unspecified: Secondary | ICD-10-CM | POA: Diagnosis not present

## 2017-07-08 DIAGNOSIS — Z7902 Long term (current) use of antithrombotics/antiplatelets: Secondary | ICD-10-CM | POA: Diagnosis not present

## 2017-07-08 DIAGNOSIS — Z7984 Long term (current) use of oral hypoglycemic drugs: Secondary | ICD-10-CM | POA: Diagnosis not present

## 2017-07-08 DIAGNOSIS — A414 Sepsis due to anaerobes: Secondary | ICD-10-CM | POA: Diagnosis not present

## 2017-07-10 DIAGNOSIS — I739 Peripheral vascular disease, unspecified: Secondary | ICD-10-CM | POA: Diagnosis not present

## 2017-07-10 DIAGNOSIS — Z7951 Long term (current) use of inhaled steroids: Secondary | ICD-10-CM | POA: Diagnosis not present

## 2017-07-10 DIAGNOSIS — E785 Hyperlipidemia, unspecified: Secondary | ICD-10-CM | POA: Diagnosis not present

## 2017-07-10 DIAGNOSIS — Z7902 Long term (current) use of antithrombotics/antiplatelets: Secondary | ICD-10-CM | POA: Diagnosis not present

## 2017-07-10 DIAGNOSIS — A419 Sepsis, unspecified organism: Secondary | ICD-10-CM | POA: Diagnosis not present

## 2017-07-10 DIAGNOSIS — I5040 Unspecified combined systolic (congestive) and diastolic (congestive) heart failure: Secondary | ICD-10-CM | POA: Diagnosis not present

## 2017-07-10 DIAGNOSIS — Z7984 Long term (current) use of oral hypoglycemic drugs: Secondary | ICD-10-CM | POA: Diagnosis not present

## 2017-07-10 DIAGNOSIS — I272 Pulmonary hypertension, unspecified: Secondary | ICD-10-CM | POA: Diagnosis not present

## 2017-07-14 DIAGNOSIS — I739 Peripheral vascular disease, unspecified: Secondary | ICD-10-CM | POA: Diagnosis not present

## 2017-07-14 DIAGNOSIS — I272 Pulmonary hypertension, unspecified: Secondary | ICD-10-CM | POA: Diagnosis not present

## 2017-07-14 DIAGNOSIS — Z7902 Long term (current) use of antithrombotics/antiplatelets: Secondary | ICD-10-CM | POA: Diagnosis not present

## 2017-07-14 DIAGNOSIS — A419 Sepsis, unspecified organism: Secondary | ICD-10-CM | POA: Diagnosis not present

## 2017-07-14 DIAGNOSIS — Z7984 Long term (current) use of oral hypoglycemic drugs: Secondary | ICD-10-CM | POA: Diagnosis not present

## 2017-07-14 DIAGNOSIS — E785 Hyperlipidemia, unspecified: Secondary | ICD-10-CM | POA: Diagnosis not present

## 2017-07-14 DIAGNOSIS — I5040 Unspecified combined systolic (congestive) and diastolic (congestive) heart failure: Secondary | ICD-10-CM | POA: Diagnosis not present

## 2017-07-14 DIAGNOSIS — Z7951 Long term (current) use of inhaled steroids: Secondary | ICD-10-CM | POA: Diagnosis not present

## 2017-07-17 DIAGNOSIS — Z7984 Long term (current) use of oral hypoglycemic drugs: Secondary | ICD-10-CM | POA: Diagnosis not present

## 2017-07-17 DIAGNOSIS — Z7902 Long term (current) use of antithrombotics/antiplatelets: Secondary | ICD-10-CM | POA: Diagnosis not present

## 2017-07-17 DIAGNOSIS — I272 Pulmonary hypertension, unspecified: Secondary | ICD-10-CM | POA: Diagnosis not present

## 2017-07-17 DIAGNOSIS — I5040 Unspecified combined systolic (congestive) and diastolic (congestive) heart failure: Secondary | ICD-10-CM | POA: Diagnosis not present

## 2017-07-17 DIAGNOSIS — Z7951 Long term (current) use of inhaled steroids: Secondary | ICD-10-CM | POA: Diagnosis not present

## 2017-07-17 DIAGNOSIS — I739 Peripheral vascular disease, unspecified: Secondary | ICD-10-CM | POA: Diagnosis not present

## 2017-07-17 DIAGNOSIS — E785 Hyperlipidemia, unspecified: Secondary | ICD-10-CM | POA: Diagnosis not present

## 2017-07-17 DIAGNOSIS — A419 Sepsis, unspecified organism: Secondary | ICD-10-CM | POA: Diagnosis not present

## 2017-07-18 DIAGNOSIS — I272 Pulmonary hypertension, unspecified: Secondary | ICD-10-CM | POA: Diagnosis not present

## 2017-07-18 DIAGNOSIS — Z7902 Long term (current) use of antithrombotics/antiplatelets: Secondary | ICD-10-CM | POA: Diagnosis not present

## 2017-07-18 DIAGNOSIS — I5040 Unspecified combined systolic (congestive) and diastolic (congestive) heart failure: Secondary | ICD-10-CM | POA: Diagnosis not present

## 2017-07-18 DIAGNOSIS — Z7984 Long term (current) use of oral hypoglycemic drugs: Secondary | ICD-10-CM | POA: Diagnosis not present

## 2017-07-18 DIAGNOSIS — A419 Sepsis, unspecified organism: Secondary | ICD-10-CM | POA: Diagnosis not present

## 2017-07-18 DIAGNOSIS — E785 Hyperlipidemia, unspecified: Secondary | ICD-10-CM | POA: Diagnosis not present

## 2017-07-18 DIAGNOSIS — I739 Peripheral vascular disease, unspecified: Secondary | ICD-10-CM | POA: Diagnosis not present

## 2017-07-18 DIAGNOSIS — Z7951 Long term (current) use of inhaled steroids: Secondary | ICD-10-CM | POA: Diagnosis not present

## 2017-07-20 DIAGNOSIS — R0789 Other chest pain: Secondary | ICD-10-CM | POA: Diagnosis not present

## 2017-07-20 DIAGNOSIS — I200 Unstable angina: Secondary | ICD-10-CM | POA: Diagnosis not present

## 2017-07-20 DIAGNOSIS — R079 Chest pain, unspecified: Secondary | ICD-10-CM | POA: Diagnosis not present

## 2017-07-20 DIAGNOSIS — I110 Hypertensive heart disease with heart failure: Secondary | ICD-10-CM | POA: Diagnosis not present

## 2017-07-20 DIAGNOSIS — R42 Dizziness and giddiness: Secondary | ICD-10-CM | POA: Diagnosis not present

## 2017-07-20 DIAGNOSIS — I452 Bifascicular block: Secondary | ICD-10-CM | POA: Diagnosis not present

## 2017-07-21 DIAGNOSIS — R0789 Other chest pain: Secondary | ICD-10-CM | POA: Diagnosis not present

## 2017-07-28 DIAGNOSIS — I201 Angina pectoris with documented spasm: Secondary | ICD-10-CM | POA: Diagnosis not present

## 2017-07-31 IMAGING — RF DG RETROGRADE PYELOGRAM
1 series · 1 of 1 positions shown · non-contrast
Comparison: CT of the abdomen and pelvis on 02/16/2016

CLINICAL DATA: Right-sided ureteral calculus.

EXAM:
INTRAOPERATIVE RIGHT RETROGRADE UROGRAPHY
TECHNIQUE: Images were obtained with the C-arm fluoroscopic device
intraoperatively and submitted for interpretation post-operatively.
Please see the procedural report for the amount of contrast and the
fluoroscopy time utilized.

[Series 1: run · 1 of 1 slices shown]
[im 1/1]
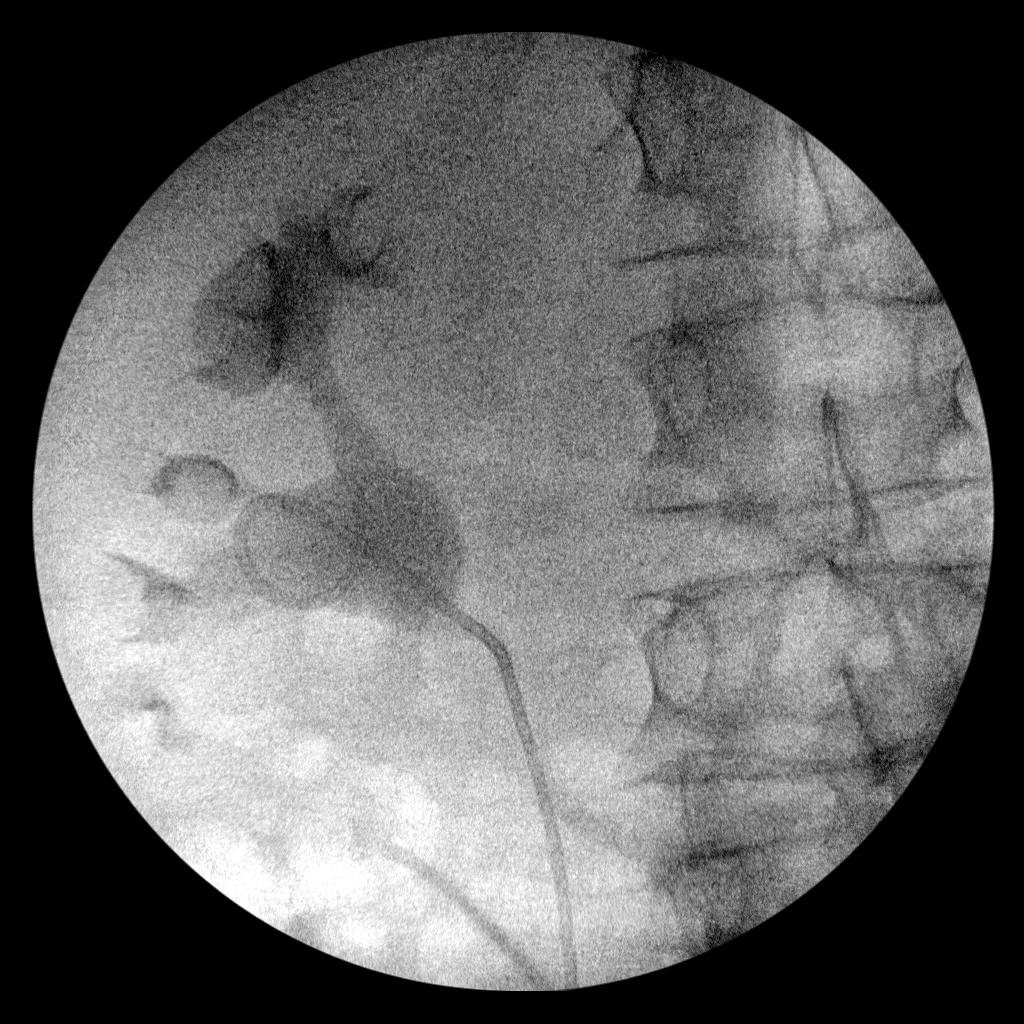

[1 of 1 positions shown; findings below may reference images not displayed]

FINDINGS: Intraoperative image demonstrates an opacified collecting system
without significant hydronephrosis or filling defects visualized.
The superior aspect of a right ureteral stent extends into the renal
pelvis.
IMPRESSION: Proximal ureteral stent extends into the renal pelvis.

## 2017-08-09 DIAGNOSIS — R0789 Other chest pain: Secondary | ICD-10-CM | POA: Diagnosis not present

## 2017-08-09 DIAGNOSIS — R079 Chest pain, unspecified: Secondary | ICD-10-CM | POA: Diagnosis not present

## 2017-08-10 DIAGNOSIS — R0789 Other chest pain: Secondary | ICD-10-CM | POA: Diagnosis not present

## 2017-08-19 DIAGNOSIS — I5032 Chronic diastolic (congestive) heart failure: Secondary | ICD-10-CM | POA: Diagnosis not present

## 2017-08-19 DIAGNOSIS — Z6821 Body mass index (BMI) 21.0-21.9, adult: Secondary | ICD-10-CM | POA: Diagnosis not present

## 2017-08-25 DIAGNOSIS — I10 Essential (primary) hypertension: Secondary | ICD-10-CM | POA: Diagnosis not present

## 2017-08-25 DIAGNOSIS — I5032 Chronic diastolic (congestive) heart failure: Secondary | ICD-10-CM | POA: Diagnosis not present

## 2017-08-25 DIAGNOSIS — Z6821 Body mass index (BMI) 21.0-21.9, adult: Secondary | ICD-10-CM | POA: Diagnosis not present

## 2017-08-25 DIAGNOSIS — E119 Type 2 diabetes mellitus without complications: Secondary | ICD-10-CM | POA: Diagnosis not present

## 2017-08-25 DIAGNOSIS — E1165 Type 2 diabetes mellitus with hyperglycemia: Secondary | ICD-10-CM | POA: Diagnosis not present

## 2017-10-20 DIAGNOSIS — I10 Essential (primary) hypertension: Secondary | ICD-10-CM | POA: Diagnosis not present

## 2017-10-20 DIAGNOSIS — J029 Acute pharyngitis, unspecified: Secondary | ICD-10-CM | POA: Diagnosis not present

## 2017-10-20 DIAGNOSIS — E119 Type 2 diabetes mellitus without complications: Secondary | ICD-10-CM | POA: Diagnosis not present

## 2017-10-20 DIAGNOSIS — I5032 Chronic diastolic (congestive) heart failure: Secondary | ICD-10-CM | POA: Diagnosis not present

## 2017-10-20 DIAGNOSIS — Z0000 Encounter for general adult medical examination without abnormal findings: Secondary | ICD-10-CM | POA: Diagnosis not present

## 2017-10-20 DIAGNOSIS — Z1389 Encounter for screening for other disorder: Secondary | ICD-10-CM | POA: Diagnosis not present

## 2017-11-02 DIAGNOSIS — R0789 Other chest pain: Secondary | ICD-10-CM | POA: Diagnosis not present

## 2017-11-02 DIAGNOSIS — R05 Cough: Secondary | ICD-10-CM | POA: Diagnosis not present

## 2017-11-02 DIAGNOSIS — E869 Volume depletion, unspecified: Secondary | ICD-10-CM | POA: Diagnosis not present

## 2017-11-02 DIAGNOSIS — I959 Hypotension, unspecified: Secondary | ICD-10-CM | POA: Diagnosis not present

## 2017-11-02 DIAGNOSIS — R079 Chest pain, unspecified: Secondary | ICD-10-CM | POA: Diagnosis not present

## 2017-11-02 DIAGNOSIS — I480 Paroxysmal atrial fibrillation: Secondary | ICD-10-CM | POA: Diagnosis not present

## 2017-11-02 DIAGNOSIS — E860 Dehydration: Secondary | ICD-10-CM | POA: Diagnosis not present

## 2017-11-02 DIAGNOSIS — I5042 Chronic combined systolic (congestive) and diastolic (congestive) heart failure: Secondary | ICD-10-CM | POA: Diagnosis not present

## 2017-11-11 DIAGNOSIS — I201 Angina pectoris with documented spasm: Secondary | ICD-10-CM | POA: Diagnosis not present

## 2017-11-24 ENCOUNTER — Other Ambulatory Visit: Payer: Self-pay | Admitting: Urology

## 2017-11-24 DIAGNOSIS — N200 Calculus of kidney: Secondary | ICD-10-CM

## 2017-12-16 ENCOUNTER — Ambulatory Visit (HOSPITAL_COMMUNITY)
Admission: RE | Admit: 2017-12-16 | Discharge: 2017-12-16 | Disposition: A | Discharge status: Home / Self Care | Payer: Medicare Other | Source: Ambulatory Visit | Attending: Urology | Admitting: Urology

## 2017-12-16 DIAGNOSIS — N200 Calculus of kidney: Secondary | ICD-10-CM | POA: Diagnosis present

## 2017-12-17 ENCOUNTER — Ambulatory Visit: Payer: Medicare Other | Admitting: Urology

## 2017-12-17 DIAGNOSIS — N200 Calculus of kidney: Secondary | ICD-10-CM | POA: Diagnosis not present

## 2018-01-20 DIAGNOSIS — E782 Mixed hyperlipidemia: Secondary | ICD-10-CM | POA: Diagnosis not present

## 2018-01-20 DIAGNOSIS — E1142 Type 2 diabetes mellitus with diabetic polyneuropathy: Secondary | ICD-10-CM | POA: Diagnosis not present

## 2018-01-20 DIAGNOSIS — Z6821 Body mass index (BMI) 21.0-21.9, adult: Secondary | ICD-10-CM | POA: Diagnosis not present

## 2018-01-20 DIAGNOSIS — I10 Essential (primary) hypertension: Secondary | ICD-10-CM | POA: Diagnosis not present

## 2018-01-29 DIAGNOSIS — H353110 Nonexudative age-related macular degeneration, right eye, stage unspecified: Secondary | ICD-10-CM | POA: Diagnosis not present

## 2018-01-29 DIAGNOSIS — H524 Presbyopia: Secondary | ICD-10-CM | POA: Diagnosis not present

## 2018-02-10 DIAGNOSIS — H353110 Nonexudative age-related macular degeneration, right eye, stage unspecified: Secondary | ICD-10-CM | POA: Diagnosis not present

## 2018-03-16 DIAGNOSIS — Z6821 Body mass index (BMI) 21.0-21.9, adult: Secondary | ICD-10-CM | POA: Diagnosis not present

## 2018-03-16 DIAGNOSIS — E782 Mixed hyperlipidemia: Secondary | ICD-10-CM | POA: Diagnosis not present

## 2018-03-16 DIAGNOSIS — Z0000 Encounter for general adult medical examination without abnormal findings: Secondary | ICD-10-CM | POA: Diagnosis not present

## 2018-03-16 DIAGNOSIS — E1142 Type 2 diabetes mellitus with diabetic polyneuropathy: Secondary | ICD-10-CM | POA: Diagnosis not present

## 2018-03-16 DIAGNOSIS — I10 Essential (primary) hypertension: Secondary | ICD-10-CM | POA: Diagnosis not present

## 2018-03-16 DIAGNOSIS — I5033 Acute on chronic diastolic (congestive) heart failure: Secondary | ICD-10-CM | POA: Diagnosis not present

## 2018-05-20 DIAGNOSIS — Z6821 Body mass index (BMI) 21.0-21.9, adult: Secondary | ICD-10-CM | POA: Diagnosis not present

## 2018-05-20 DIAGNOSIS — I83893 Varicose veins of bilateral lower extremities with other complications: Secondary | ICD-10-CM | POA: Diagnosis not present

## 2018-05-29 DIAGNOSIS — G5793 Unspecified mononeuropathy of bilateral lower limbs: Secondary | ICD-10-CM | POA: Diagnosis not present

## 2018-05-29 DIAGNOSIS — Z7902 Long term (current) use of antithrombotics/antiplatelets: Secondary | ICD-10-CM | POA: Diagnosis not present

## 2018-05-29 DIAGNOSIS — M792 Neuralgia and neuritis, unspecified: Secondary | ICD-10-CM | POA: Diagnosis not present

## 2018-05-29 DIAGNOSIS — M545 Low back pain: Secondary | ICD-10-CM | POA: Diagnosis not present

## 2018-05-29 DIAGNOSIS — Z7984 Long term (current) use of oral hypoglycemic drugs: Secondary | ICD-10-CM | POA: Diagnosis not present

## 2018-06-04 DIAGNOSIS — R0789 Other chest pain: Secondary | ICD-10-CM | POA: Diagnosis not present

## 2018-06-04 DIAGNOSIS — R42 Dizziness and giddiness: Secondary | ICD-10-CM | POA: Diagnosis not present

## 2018-06-04 DIAGNOSIS — R531 Weakness: Secondary | ICD-10-CM | POA: Diagnosis not present

## 2018-06-04 DIAGNOSIS — J9811 Atelectasis: Secondary | ICD-10-CM | POA: Diagnosis not present

## 2018-06-05 DIAGNOSIS — R42 Dizziness and giddiness: Secondary | ICD-10-CM | POA: Diagnosis not present

## 2018-06-05 DIAGNOSIS — R531 Weakness: Secondary | ICD-10-CM | POA: Diagnosis not present

## 2018-06-05 DIAGNOSIS — R0789 Other chest pain: Secondary | ICD-10-CM | POA: Diagnosis not present

## 2018-06-11 DIAGNOSIS — R0789 Other chest pain: Secondary | ICD-10-CM | POA: Diagnosis not present

## 2018-06-14 DIAGNOSIS — Z79899 Other long term (current) drug therapy: Secondary | ICD-10-CM | POA: Diagnosis not present

## 2018-06-14 DIAGNOSIS — R279 Unspecified lack of coordination: Secondary | ICD-10-CM | POA: Diagnosis not present

## 2018-06-14 DIAGNOSIS — S065X9A Traumatic subdural hemorrhage with loss of consciousness of unspecified duration, initial encounter: Secondary | ICD-10-CM | POA: Diagnosis not present

## 2018-06-14 DIAGNOSIS — N200 Calculus of kidney: Secondary | ICD-10-CM | POA: Diagnosis not present

## 2018-06-14 DIAGNOSIS — S022XXA Fracture of nasal bones, initial encounter for closed fracture: Secondary | ICD-10-CM | POA: Diagnosis not present

## 2018-06-14 DIAGNOSIS — S0083XA Contusion of other part of head, initial encounter: Secondary | ICD-10-CM | POA: Diagnosis not present

## 2018-06-14 DIAGNOSIS — R2681 Unsteadiness on feet: Secondary | ICD-10-CM | POA: Diagnosis not present

## 2018-06-14 DIAGNOSIS — G44309 Post-traumatic headache, unspecified, not intractable: Secondary | ICD-10-CM | POA: Diagnosis not present

## 2018-06-14 DIAGNOSIS — S066X0A Traumatic subarachnoid hemorrhage without loss of consciousness, initial encounter: Secondary | ICD-10-CM | POA: Diagnosis not present

## 2018-06-14 DIAGNOSIS — S066X9A Traumatic subarachnoid hemorrhage with loss of consciousness of unspecified duration, initial encounter: Secondary | ICD-10-CM | POA: Diagnosis not present

## 2018-06-14 DIAGNOSIS — S60222A Contusion of left hand, initial encounter: Secondary | ICD-10-CM | POA: Diagnosis not present

## 2018-06-14 DIAGNOSIS — W19XXXA Unspecified fall, initial encounter: Secondary | ICD-10-CM | POA: Insufficient documentation

## 2018-06-14 DIAGNOSIS — Z743 Need for continuous supervision: Secondary | ICD-10-CM | POA: Diagnosis not present

## 2018-06-14 DIAGNOSIS — M79642 Pain in left hand: Secondary | ICD-10-CM | POA: Diagnosis not present

## 2018-06-14 DIAGNOSIS — Z043 Encounter for examination and observation following other accident: Secondary | ICD-10-CM | POA: Diagnosis not present

## 2018-06-14 DIAGNOSIS — R531 Weakness: Secondary | ICD-10-CM | POA: Diagnosis not present

## 2018-06-14 DIAGNOSIS — I712 Thoracic aortic aneurysm, without rupture: Secondary | ICD-10-CM | POA: Diagnosis not present

## 2018-06-14 DIAGNOSIS — S60221A Contusion of right hand, initial encounter: Secondary | ICD-10-CM | POA: Diagnosis not present

## 2018-06-14 DIAGNOSIS — S069X0A Unspecified intracranial injury without loss of consciousness, initial encounter: Secondary | ICD-10-CM | POA: Diagnosis not present

## 2018-06-14 DIAGNOSIS — S6992XA Unspecified injury of left wrist, hand and finger(s), initial encounter: Secondary | ICD-10-CM | POA: Diagnosis not present

## 2018-06-14 DIAGNOSIS — D696 Thrombocytopenia, unspecified: Secondary | ICD-10-CM | POA: Insufficient documentation

## 2018-06-14 DIAGNOSIS — W1839XA Other fall on same level, initial encounter: Secondary | ICD-10-CM | POA: Diagnosis not present

## 2018-06-14 DIAGNOSIS — S199XXA Unspecified injury of neck, initial encounter: Secondary | ICD-10-CM | POA: Diagnosis not present

## 2018-06-14 DIAGNOSIS — R918 Other nonspecific abnormal finding of lung field: Secondary | ICD-10-CM | POA: Diagnosis not present

## 2018-06-14 DIAGNOSIS — S6991XA Unspecified injury of right wrist, hand and finger(s), initial encounter: Secondary | ICD-10-CM | POA: Diagnosis not present

## 2018-06-14 DIAGNOSIS — R413 Other amnesia: Secondary | ICD-10-CM | POA: Diagnosis not present

## 2018-06-14 DIAGNOSIS — I609 Nontraumatic subarachnoid hemorrhage, unspecified: Secondary | ICD-10-CM | POA: Insufficient documentation

## 2018-06-14 DIAGNOSIS — M79641 Pain in right hand: Secondary | ICD-10-CM | POA: Diagnosis not present

## 2018-06-14 DIAGNOSIS — S066X0D Traumatic subarachnoid hemorrhage without loss of consciousness, subsequent encounter: Secondary | ICD-10-CM | POA: Diagnosis not present

## 2018-06-18 DIAGNOSIS — Z9181 History of falling: Secondary | ICD-10-CM | POA: Diagnosis not present

## 2018-06-18 DIAGNOSIS — S066X0D Traumatic subarachnoid hemorrhage without loss of consciousness, subsequent encounter: Secondary | ICD-10-CM | POA: Diagnosis not present

## 2018-06-18 DIAGNOSIS — R531 Weakness: Secondary | ICD-10-CM | POA: Diagnosis not present

## 2018-06-18 DIAGNOSIS — Z743 Need for continuous supervision: Secondary | ICD-10-CM | POA: Diagnosis not present

## 2018-06-18 DIAGNOSIS — R279 Unspecified lack of coordination: Secondary | ICD-10-CM | POA: Diagnosis not present

## 2018-06-18 DIAGNOSIS — I10 Essential (primary) hypertension: Secondary | ICD-10-CM | POA: Diagnosis not present

## 2018-06-18 DIAGNOSIS — S066X9A Traumatic subarachnoid hemorrhage with loss of consciousness of unspecified duration, initial encounter: Secondary | ICD-10-CM | POA: Diagnosis not present

## 2018-06-18 DIAGNOSIS — E119 Type 2 diabetes mellitus without complications: Secondary | ICD-10-CM | POA: Diagnosis not present

## 2018-06-18 DIAGNOSIS — E7849 Other hyperlipidemia: Secondary | ICD-10-CM | POA: Diagnosis not present

## 2018-06-18 DIAGNOSIS — I608 Other nontraumatic subarachnoid hemorrhage: Secondary | ICD-10-CM | POA: Diagnosis not present

## 2018-06-18 DIAGNOSIS — S066X0A Traumatic subarachnoid hemorrhage without loss of consciousness, initial encounter: Secondary | ICD-10-CM | POA: Diagnosis not present

## 2018-06-19 DIAGNOSIS — I10 Essential (primary) hypertension: Secondary | ICD-10-CM | POA: Diagnosis not present

## 2018-06-19 DIAGNOSIS — E7849 Other hyperlipidemia: Secondary | ICD-10-CM | POA: Diagnosis not present

## 2018-06-19 DIAGNOSIS — I608 Other nontraumatic subarachnoid hemorrhage: Secondary | ICD-10-CM | POA: Diagnosis not present

## 2018-06-19 DIAGNOSIS — E119 Type 2 diabetes mellitus without complications: Secondary | ICD-10-CM | POA: Diagnosis not present

## 2018-06-23 IMAGING — US US RENAL
1 series · 14 of 25 positions shown · non-contrast
Comparison: 02/16/2016 CT.

CLINICAL DATA: 87-year-old male with history of renal calculi.
Subsequent encounter.

EXAM:
RENAL / URINARY TRACT ULTRASOUND COMPLETE

[Series 1: us renal · 0.21mm/px · 14 of 66 slices shown]
[im 1/66]
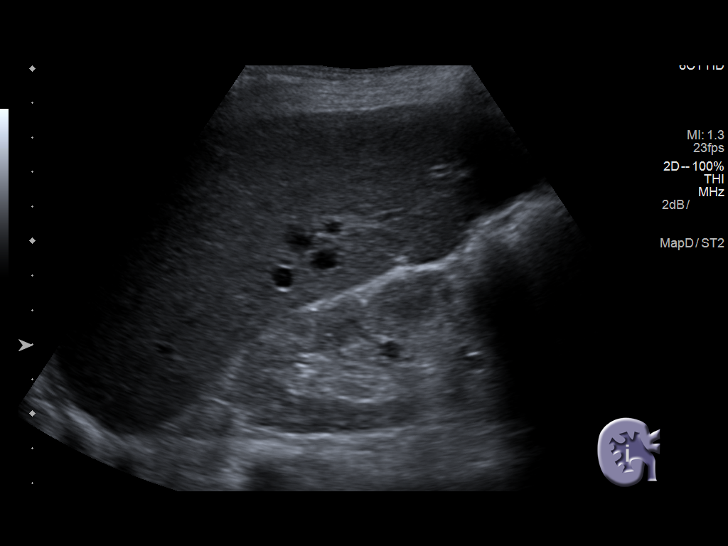
[im 6/66]
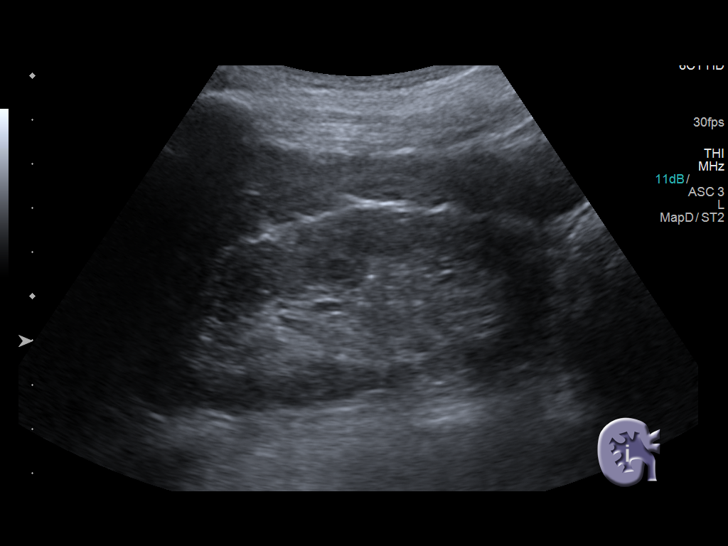
[im 11/66]
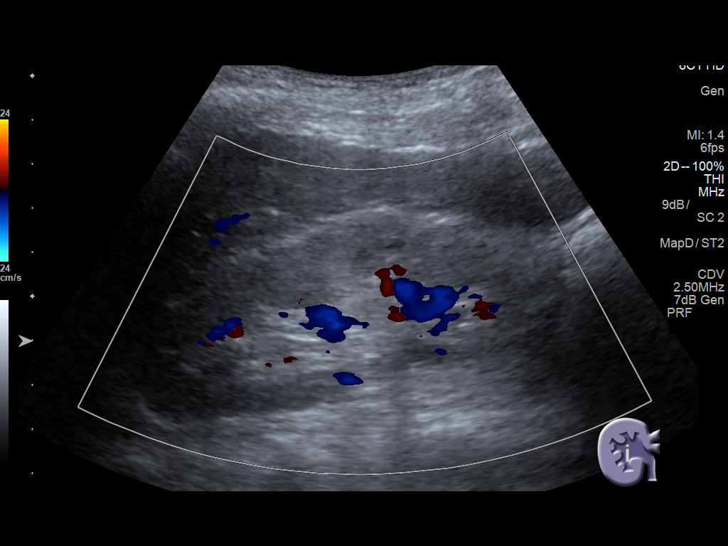
[im 17/66]
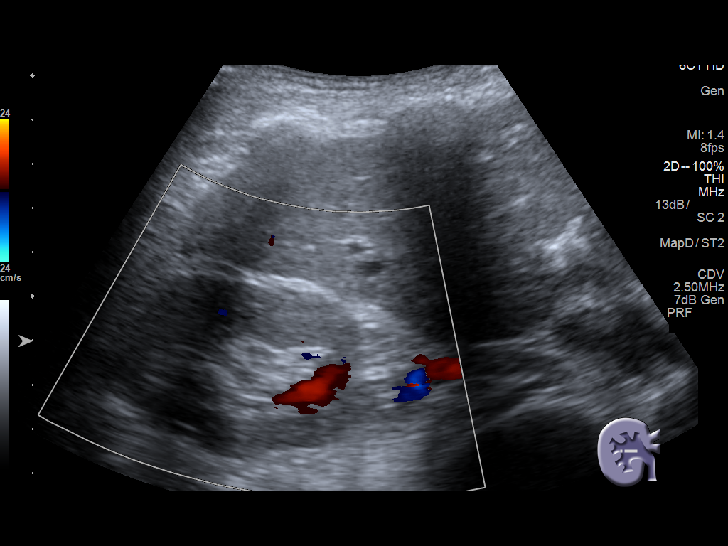
[im 22/66]
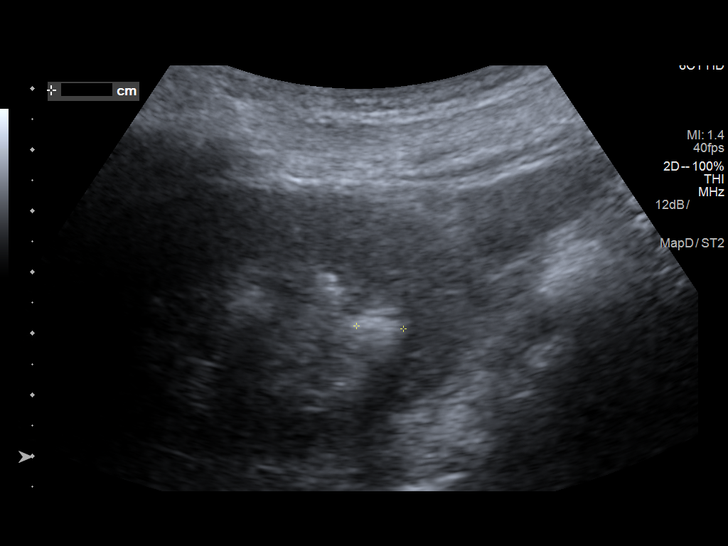
[im 25/66]
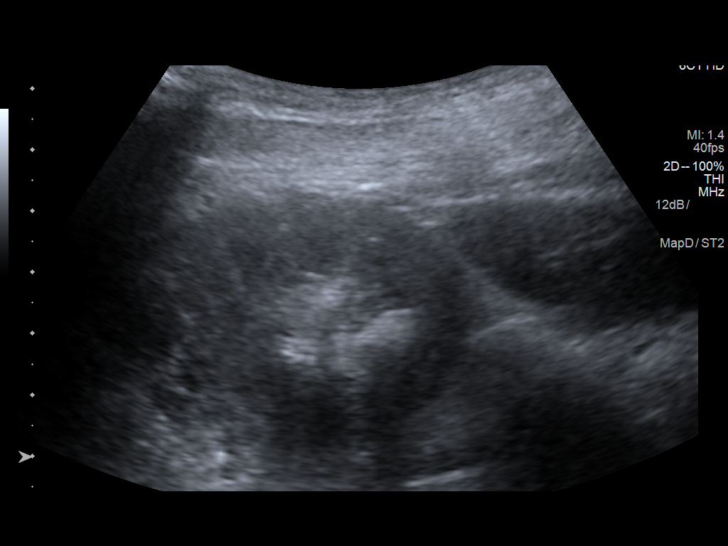
[im 30/66]
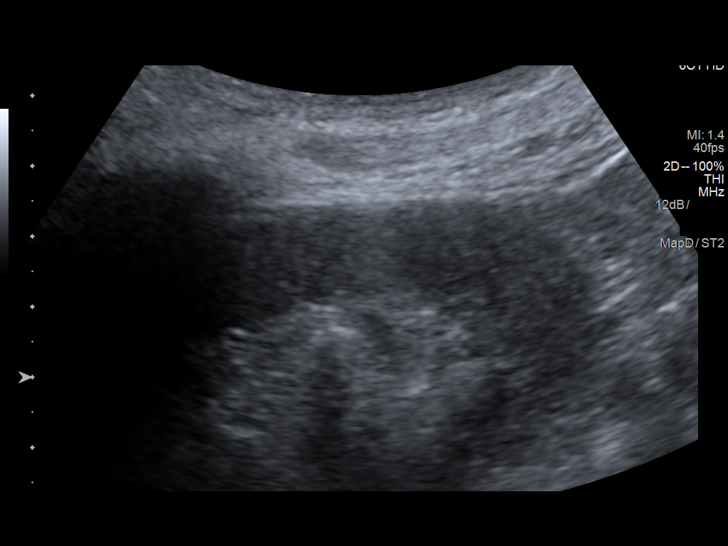
[im 36/66]
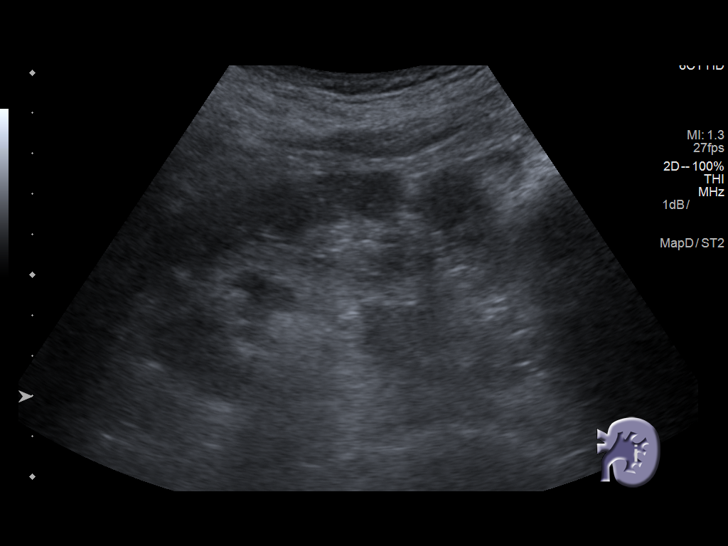
[im 41/66]
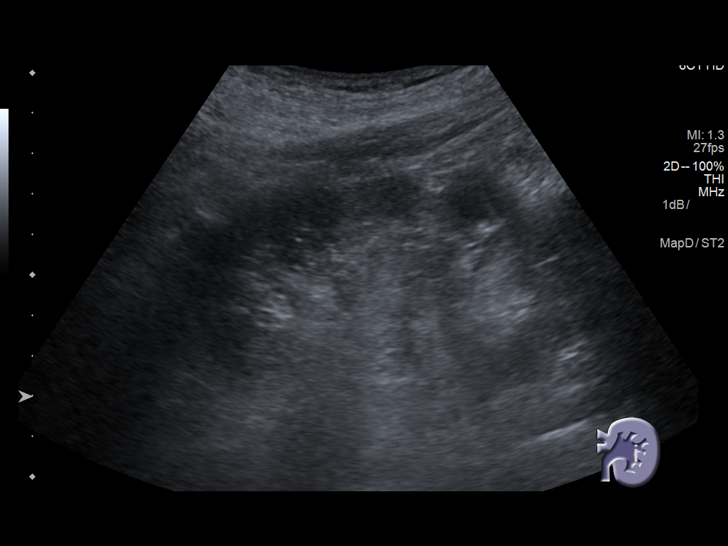
[im 44/66]
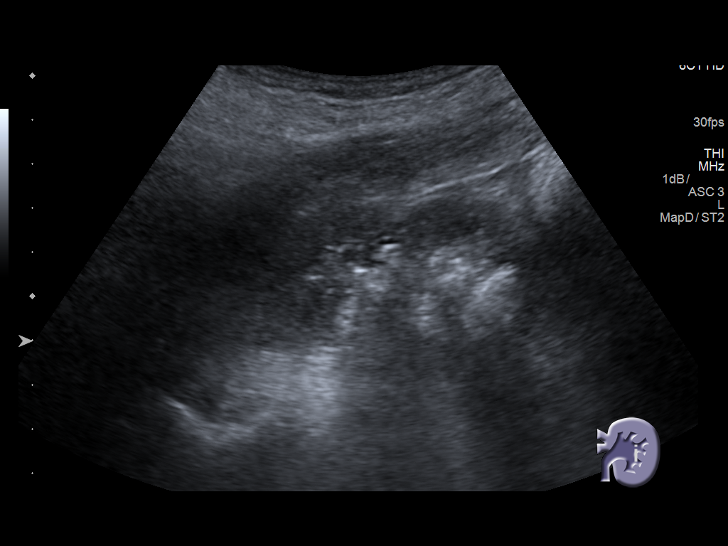
[im 49/66]
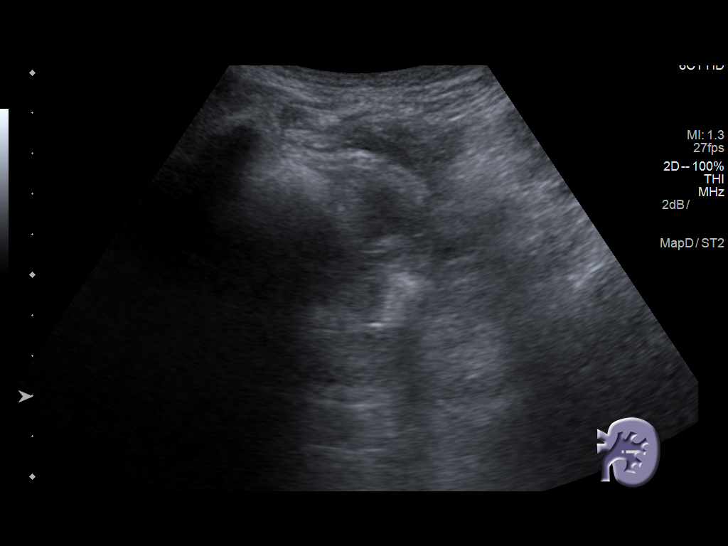
[im 55/66]
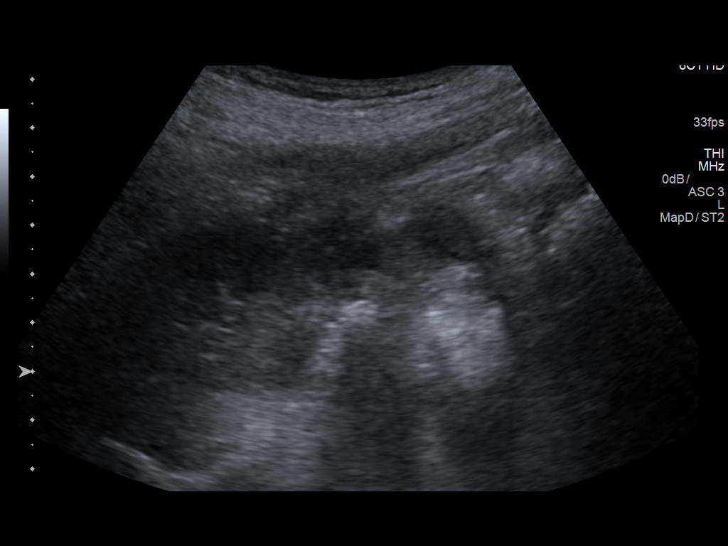
[im 60/66]
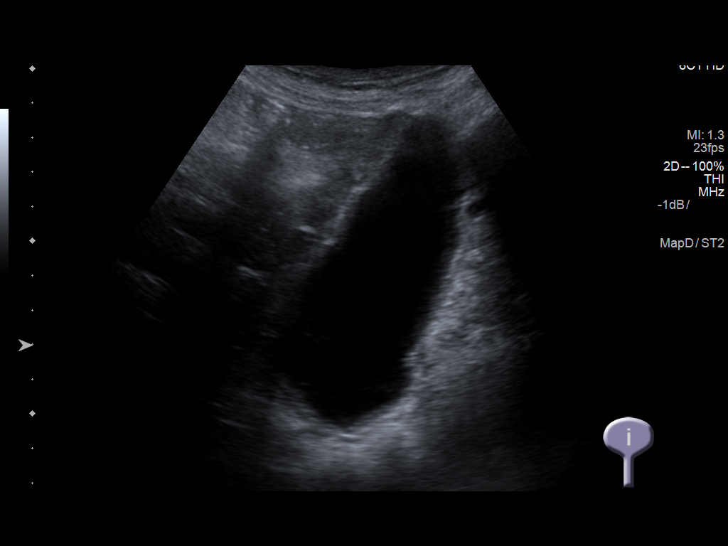
[im 66/66]
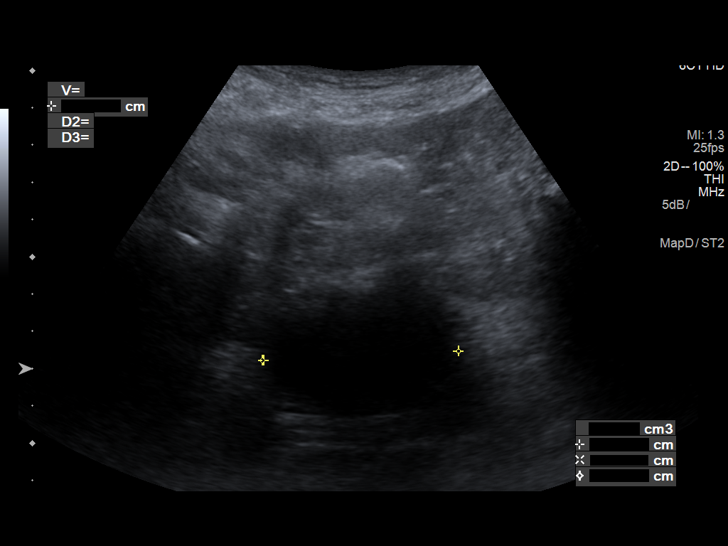

[14 of 25 positions shown; findings below may reference images not displayed]

FINDINGS: Right Kidney:

Length: 11.4 cm. Echogenicity within normal limits. Multiple
nonobstructing right renal calculi largest lower pole measuring 8
mm. No hydronephrosis.

Left Kidney:

Length: 11.4 cm. Echogenicity within normal limits. Multiple
nonobstructing left renal calculi largest lower pole measuring 11
mm. No hydronephrosis.

Bladder:

Appears normal for degree of bladder distention. Prevoid volume 195
cc. Postvoid volume 39 cc.
IMPRESSION: Bilateral nonobstructing renal calculi.  No hydronephrosis.

## 2018-07-11 DIAGNOSIS — J449 Chronic obstructive pulmonary disease, unspecified: Secondary | ICD-10-CM | POA: Diagnosis not present

## 2018-07-11 DIAGNOSIS — S022XXD Fracture of nasal bones, subsequent encounter for fracture with routine healing: Secondary | ICD-10-CM | POA: Diagnosis not present

## 2018-07-11 DIAGNOSIS — I480 Paroxysmal atrial fibrillation: Secondary | ICD-10-CM | POA: Diagnosis not present

## 2018-07-11 DIAGNOSIS — I5030 Unspecified diastolic (congestive) heart failure: Secondary | ICD-10-CM | POA: Diagnosis not present

## 2018-07-11 DIAGNOSIS — I110 Hypertensive heart disease with heart failure: Secondary | ICD-10-CM | POA: Diagnosis not present

## 2018-07-11 DIAGNOSIS — Z7984 Long term (current) use of oral hypoglycemic drugs: Secondary | ICD-10-CM | POA: Diagnosis not present

## 2018-07-11 DIAGNOSIS — Z7982 Long term (current) use of aspirin: Secondary | ICD-10-CM | POA: Diagnosis not present

## 2018-07-11 DIAGNOSIS — W19XXXD Unspecified fall, subsequent encounter: Secondary | ICD-10-CM | POA: Diagnosis not present

## 2018-07-11 DIAGNOSIS — E1142 Type 2 diabetes mellitus with diabetic polyneuropathy: Secondary | ICD-10-CM | POA: Diagnosis not present

## 2018-07-11 DIAGNOSIS — I959 Hypotension, unspecified: Secondary | ICD-10-CM | POA: Diagnosis not present

## 2018-07-11 DIAGNOSIS — Z9181 History of falling: Secondary | ICD-10-CM | POA: Diagnosis not present

## 2018-07-11 DIAGNOSIS — S066X0D Traumatic subarachnoid hemorrhage without loss of consciousness, subsequent encounter: Secondary | ICD-10-CM | POA: Diagnosis not present

## 2018-07-13 ENCOUNTER — Other Ambulatory Visit: Payer: Self-pay

## 2018-07-13 DIAGNOSIS — E1142 Type 2 diabetes mellitus with diabetic polyneuropathy: Secondary | ICD-10-CM | POA: Diagnosis not present

## 2018-07-13 DIAGNOSIS — I959 Hypotension, unspecified: Secondary | ICD-10-CM | POA: Diagnosis not present

## 2018-07-13 DIAGNOSIS — W19XXXD Unspecified fall, subsequent encounter: Secondary | ICD-10-CM | POA: Diagnosis not present

## 2018-07-13 DIAGNOSIS — Z7982 Long term (current) use of aspirin: Secondary | ICD-10-CM | POA: Diagnosis not present

## 2018-07-13 DIAGNOSIS — J449 Chronic obstructive pulmonary disease, unspecified: Secondary | ICD-10-CM | POA: Diagnosis not present

## 2018-07-13 DIAGNOSIS — I480 Paroxysmal atrial fibrillation: Secondary | ICD-10-CM | POA: Diagnosis not present

## 2018-07-13 DIAGNOSIS — S022XXD Fracture of nasal bones, subsequent encounter for fracture with routine healing: Secondary | ICD-10-CM | POA: Diagnosis not present

## 2018-07-13 DIAGNOSIS — Z9181 History of falling: Secondary | ICD-10-CM | POA: Diagnosis not present

## 2018-07-13 DIAGNOSIS — Z7984 Long term (current) use of oral hypoglycemic drugs: Secondary | ICD-10-CM | POA: Diagnosis not present

## 2018-07-13 DIAGNOSIS — I5030 Unspecified diastolic (congestive) heart failure: Secondary | ICD-10-CM | POA: Diagnosis not present

## 2018-07-13 DIAGNOSIS — S066X0D Traumatic subarachnoid hemorrhage without loss of consciousness, subsequent encounter: Secondary | ICD-10-CM | POA: Diagnosis not present

## 2018-07-13 DIAGNOSIS — I110 Hypertensive heart disease with heart failure: Secondary | ICD-10-CM | POA: Diagnosis not present

## 2018-07-13 NOTE — Patient Outreach (Signed)
[facility]) Care Management  07/13/2018  Kyle Santana Mar 28, 1931 [id number]  EMMI: general discharge Referral date: 07/13/18 Referral reason: scheduled follow up: NO,  Unfilled prescriptions: YES,  Insurance: [employer] care Day # 1 Attempt #1  Telephone call to patient regarding referral. Unable to reach patient. HIPAA compliant voice message left with call back phone number.   PLAN: RNCM will attempt 2nd telephone call to patient within 4 business days. RNCM will send outreach letter.   Quinn Plowman RN,BSN, [place] Telephonic  [phone]

## 2018-07-15 ENCOUNTER — Other Ambulatory Visit: Payer: Self-pay

## 2018-07-15 ENCOUNTER — Ambulatory Visit: Payer: Self-pay

## 2018-07-15 DIAGNOSIS — I110 Hypertensive heart disease with heart failure: Secondary | ICD-10-CM | POA: Diagnosis not present

## 2018-07-15 DIAGNOSIS — S066X0D Traumatic subarachnoid hemorrhage without loss of consciousness, subsequent encounter: Secondary | ICD-10-CM | POA: Diagnosis not present

## 2018-07-15 DIAGNOSIS — W19XXXD Unspecified fall, subsequent encounter: Secondary | ICD-10-CM | POA: Diagnosis not present

## 2018-07-15 DIAGNOSIS — Z7984 Long term (current) use of oral hypoglycemic drugs: Secondary | ICD-10-CM | POA: Diagnosis not present

## 2018-07-15 DIAGNOSIS — I480 Paroxysmal atrial fibrillation: Secondary | ICD-10-CM | POA: Diagnosis not present

## 2018-07-15 DIAGNOSIS — I959 Hypotension, unspecified: Secondary | ICD-10-CM | POA: Diagnosis not present

## 2018-07-15 DIAGNOSIS — I5030 Unspecified diastolic (congestive) heart failure: Secondary | ICD-10-CM | POA: Diagnosis not present

## 2018-07-15 DIAGNOSIS — Z7982 Long term (current) use of aspirin: Secondary | ICD-10-CM | POA: Diagnosis not present

## 2018-07-15 DIAGNOSIS — E1142 Type 2 diabetes mellitus with diabetic polyneuropathy: Secondary | ICD-10-CM | POA: Diagnosis not present

## 2018-07-15 DIAGNOSIS — J449 Chronic obstructive pulmonary disease, unspecified: Secondary | ICD-10-CM | POA: Diagnosis not present

## 2018-07-15 DIAGNOSIS — S022XXD Fracture of nasal bones, subsequent encounter for fracture with routine healing: Secondary | ICD-10-CM | POA: Diagnosis not present

## 2018-07-15 DIAGNOSIS — Z9181 History of falling: Secondary | ICD-10-CM | POA: Diagnosis not present

## 2018-07-15 NOTE — Patient Outreach (Signed)
[facility] [facility]) Care Management  07/15/2018  PANKAJ HAACK [birth year] [id number]  EMMI: general discharge Referral date: 07/13/18 Referral reason: scheduled follow up: NO,  Unfilled prescriptions: YES,  Insurance: [employer] care  Telephone call to patient regarding EMMI general discharge red alert. HIPAA verified with patient. Explained reason for call. Patient states she has an appointment with his primary MD on 07/21/18.   Patient states he has his medications and takes them as prescribed. Patient states he was in the hospital due to a fall. Patient states he hit his head and was bleeding on the brain. Patient states they had to helicopter him to the hospital.  He reports he was on Xeralto at the time which has since been discontinued.  Patient states he was transfrered from the hospital to the [facility] rehab center for therapy. Patient states home health is seeing him for nursing and therapy. Patient states he feels he is doing pretty good. He states his grandson and wife check on him and assist him when needed. Patient states he has transportation to his appointments. He states he has a cane and walker to ambulate with. Patient denies any falls since discharge from the nursing facility . Patient denies any further needs or concerns.  RNCM advised patient to notify MD of any changes in condition prior to scheduled appointment. RNCM provided contact name and number: [phone] or main office number [phone] and 24 hour nurse advise line [phone] by mail as discussed with patient.  RNCM verified patient aware of 911 services for urgent/ emergent needs.   PLAN; RNCM will close patient due to patient being assessed and having no further needs.  RNCM will mail patient [facility] care management brochure/ magnet as discussed.   Quinn Plowman RN,BSN,CCM [facility] Telephonic  [phone]

## 2018-07-16 DIAGNOSIS — S065X9A Traumatic subdural hemorrhage with loss of consciousness of unspecified duration, initial encounter: Secondary | ICD-10-CM | POA: Diagnosis not present

## 2018-07-17 DIAGNOSIS — S022XXD Fracture of nasal bones, subsequent encounter for fracture with routine healing: Secondary | ICD-10-CM | POA: Diagnosis not present

## 2018-07-17 DIAGNOSIS — E1142 Type 2 diabetes mellitus with diabetic polyneuropathy: Secondary | ICD-10-CM | POA: Diagnosis not present

## 2018-07-17 DIAGNOSIS — Z9181 History of falling: Secondary | ICD-10-CM | POA: Diagnosis not present

## 2018-07-17 DIAGNOSIS — J449 Chronic obstructive pulmonary disease, unspecified: Secondary | ICD-10-CM | POA: Diagnosis not present

## 2018-07-17 DIAGNOSIS — I480 Paroxysmal atrial fibrillation: Secondary | ICD-10-CM | POA: Diagnosis not present

## 2018-07-17 DIAGNOSIS — Z7982 Long term (current) use of aspirin: Secondary | ICD-10-CM | POA: Diagnosis not present

## 2018-07-17 DIAGNOSIS — Z7984 Long term (current) use of oral hypoglycemic drugs: Secondary | ICD-10-CM | POA: Diagnosis not present

## 2018-07-17 DIAGNOSIS — I5030 Unspecified diastolic (congestive) heart failure: Secondary | ICD-10-CM | POA: Diagnosis not present

## 2018-07-17 DIAGNOSIS — S066X0D Traumatic subarachnoid hemorrhage without loss of consciousness, subsequent encounter: Secondary | ICD-10-CM | POA: Diagnosis not present

## 2018-07-17 DIAGNOSIS — I959 Hypotension, unspecified: Secondary | ICD-10-CM | POA: Diagnosis not present

## 2018-07-17 DIAGNOSIS — W19XXXD Unspecified fall, subsequent encounter: Secondary | ICD-10-CM | POA: Diagnosis not present

## 2018-07-17 DIAGNOSIS — I110 Hypertensive heart disease with heart failure: Secondary | ICD-10-CM | POA: Diagnosis not present

## 2018-07-20 DIAGNOSIS — I480 Paroxysmal atrial fibrillation: Secondary | ICD-10-CM | POA: Diagnosis not present

## 2018-07-20 DIAGNOSIS — Z7984 Long term (current) use of oral hypoglycemic drugs: Secondary | ICD-10-CM | POA: Diagnosis not present

## 2018-07-20 DIAGNOSIS — I5030 Unspecified diastolic (congestive) heart failure: Secondary | ICD-10-CM | POA: Diagnosis not present

## 2018-07-20 DIAGNOSIS — W19XXXD Unspecified fall, subsequent encounter: Secondary | ICD-10-CM | POA: Diagnosis not present

## 2018-07-20 DIAGNOSIS — S066X0D Traumatic subarachnoid hemorrhage without loss of consciousness, subsequent encounter: Secondary | ICD-10-CM | POA: Diagnosis not present

## 2018-07-20 DIAGNOSIS — Z9181 History of falling: Secondary | ICD-10-CM | POA: Diagnosis not present

## 2018-07-20 DIAGNOSIS — J449 Chronic obstructive pulmonary disease, unspecified: Secondary | ICD-10-CM | POA: Diagnosis not present

## 2018-07-20 DIAGNOSIS — I959 Hypotension, unspecified: Secondary | ICD-10-CM | POA: Diagnosis not present

## 2018-07-20 DIAGNOSIS — I110 Hypertensive heart disease with heart failure: Secondary | ICD-10-CM | POA: Diagnosis not present

## 2018-07-20 DIAGNOSIS — Z7982 Long term (current) use of aspirin: Secondary | ICD-10-CM | POA: Diagnosis not present

## 2018-07-20 DIAGNOSIS — E1142 Type 2 diabetes mellitus with diabetic polyneuropathy: Secondary | ICD-10-CM | POA: Diagnosis not present

## 2018-07-20 DIAGNOSIS — S022XXD Fracture of nasal bones, subsequent encounter for fracture with routine healing: Secondary | ICD-10-CM | POA: Diagnosis not present

## 2018-07-21 DIAGNOSIS — W19XXXD Unspecified fall, subsequent encounter: Secondary | ICD-10-CM | POA: Diagnosis not present

## 2018-07-21 DIAGNOSIS — S066X0D Traumatic subarachnoid hemorrhage without loss of consciousness, subsequent encounter: Secondary | ICD-10-CM | POA: Diagnosis not present

## 2018-07-21 DIAGNOSIS — I480 Paroxysmal atrial fibrillation: Secondary | ICD-10-CM | POA: Diagnosis not present

## 2018-07-21 DIAGNOSIS — S022XXD Fracture of nasal bones, subsequent encounter for fracture with routine healing: Secondary | ICD-10-CM | POA: Diagnosis not present

## 2018-07-21 DIAGNOSIS — Z9181 History of falling: Secondary | ICD-10-CM | POA: Diagnosis not present

## 2018-07-21 DIAGNOSIS — Z7984 Long term (current) use of oral hypoglycemic drugs: Secondary | ICD-10-CM | POA: Diagnosis not present

## 2018-07-21 DIAGNOSIS — I110 Hypertensive heart disease with heart failure: Secondary | ICD-10-CM | POA: Diagnosis not present

## 2018-07-21 DIAGNOSIS — J449 Chronic obstructive pulmonary disease, unspecified: Secondary | ICD-10-CM | POA: Diagnosis not present

## 2018-07-21 DIAGNOSIS — E1142 Type 2 diabetes mellitus with diabetic polyneuropathy: Secondary | ICD-10-CM | POA: Diagnosis not present

## 2018-07-21 DIAGNOSIS — I959 Hypotension, unspecified: Secondary | ICD-10-CM | POA: Diagnosis not present

## 2018-07-21 DIAGNOSIS — Z7982 Long term (current) use of aspirin: Secondary | ICD-10-CM | POA: Diagnosis not present

## 2018-07-21 DIAGNOSIS — I5030 Unspecified diastolic (congestive) heart failure: Secondary | ICD-10-CM | POA: Diagnosis not present

## 2018-07-22 DIAGNOSIS — Z7984 Long term (current) use of oral hypoglycemic drugs: Secondary | ICD-10-CM | POA: Diagnosis not present

## 2018-07-22 DIAGNOSIS — I110 Hypertensive heart disease with heart failure: Secondary | ICD-10-CM | POA: Diagnosis not present

## 2018-07-22 DIAGNOSIS — W19XXXD Unspecified fall, subsequent encounter: Secondary | ICD-10-CM | POA: Diagnosis not present

## 2018-07-22 DIAGNOSIS — I959 Hypotension, unspecified: Secondary | ICD-10-CM | POA: Diagnosis not present

## 2018-07-22 DIAGNOSIS — Z7982 Long term (current) use of aspirin: Secondary | ICD-10-CM | POA: Diagnosis not present

## 2018-07-22 DIAGNOSIS — I480 Paroxysmal atrial fibrillation: Secondary | ICD-10-CM | POA: Diagnosis not present

## 2018-07-22 DIAGNOSIS — J449 Chronic obstructive pulmonary disease, unspecified: Secondary | ICD-10-CM | POA: Diagnosis not present

## 2018-07-22 DIAGNOSIS — Z9181 History of falling: Secondary | ICD-10-CM | POA: Diagnosis not present

## 2018-07-22 DIAGNOSIS — S022XXD Fracture of nasal bones, subsequent encounter for fracture with routine healing: Secondary | ICD-10-CM | POA: Diagnosis not present

## 2018-07-22 DIAGNOSIS — E1142 Type 2 diabetes mellitus with diabetic polyneuropathy: Secondary | ICD-10-CM | POA: Diagnosis not present

## 2018-07-22 DIAGNOSIS — S066X0D Traumatic subarachnoid hemorrhage without loss of consciousness, subsequent encounter: Secondary | ICD-10-CM | POA: Diagnosis not present

## 2018-07-22 DIAGNOSIS — I5030 Unspecified diastolic (congestive) heart failure: Secondary | ICD-10-CM | POA: Diagnosis not present

## 2018-07-23 DIAGNOSIS — I5030 Unspecified diastolic (congestive) heart failure: Secondary | ICD-10-CM | POA: Diagnosis not present

## 2018-07-23 DIAGNOSIS — S066X0D Traumatic subarachnoid hemorrhage without loss of consciousness, subsequent encounter: Secondary | ICD-10-CM | POA: Diagnosis not present

## 2018-07-23 DIAGNOSIS — I480 Paroxysmal atrial fibrillation: Secondary | ICD-10-CM | POA: Diagnosis not present

## 2018-07-23 DIAGNOSIS — J449 Chronic obstructive pulmonary disease, unspecified: Secondary | ICD-10-CM | POA: Diagnosis not present

## 2018-07-23 DIAGNOSIS — I110 Hypertensive heart disease with heart failure: Secondary | ICD-10-CM | POA: Diagnosis not present

## 2018-07-23 DIAGNOSIS — Z9181 History of falling: Secondary | ICD-10-CM | POA: Diagnosis not present

## 2018-07-23 DIAGNOSIS — E1142 Type 2 diabetes mellitus with diabetic polyneuropathy: Secondary | ICD-10-CM | POA: Diagnosis not present

## 2018-07-23 DIAGNOSIS — Z7982 Long term (current) use of aspirin: Secondary | ICD-10-CM | POA: Diagnosis not present

## 2018-07-23 DIAGNOSIS — W19XXXD Unspecified fall, subsequent encounter: Secondary | ICD-10-CM | POA: Diagnosis not present

## 2018-07-23 DIAGNOSIS — S022XXD Fracture of nasal bones, subsequent encounter for fracture with routine healing: Secondary | ICD-10-CM | POA: Diagnosis not present

## 2018-07-23 DIAGNOSIS — I959 Hypotension, unspecified: Secondary | ICD-10-CM | POA: Diagnosis not present

## 2018-07-23 DIAGNOSIS — Z7984 Long term (current) use of oral hypoglycemic drugs: Secondary | ICD-10-CM | POA: Diagnosis not present

## 2018-07-27 DIAGNOSIS — I110 Hypertensive heart disease with heart failure: Secondary | ICD-10-CM | POA: Diagnosis not present

## 2018-07-27 DIAGNOSIS — I480 Paroxysmal atrial fibrillation: Secondary | ICD-10-CM | POA: Diagnosis not present

## 2018-07-27 DIAGNOSIS — J449 Chronic obstructive pulmonary disease, unspecified: Secondary | ICD-10-CM | POA: Diagnosis not present

## 2018-07-27 DIAGNOSIS — E1142 Type 2 diabetes mellitus with diabetic polyneuropathy: Secondary | ICD-10-CM | POA: Diagnosis not present

## 2018-07-27 DIAGNOSIS — I959 Hypotension, unspecified: Secondary | ICD-10-CM | POA: Diagnosis not present

## 2018-07-27 DIAGNOSIS — Z9181 History of falling: Secondary | ICD-10-CM | POA: Diagnosis not present

## 2018-07-27 DIAGNOSIS — W19XXXD Unspecified fall, subsequent encounter: Secondary | ICD-10-CM | POA: Diagnosis not present

## 2018-07-27 DIAGNOSIS — I5030 Unspecified diastolic (congestive) heart failure: Secondary | ICD-10-CM | POA: Diagnosis not present

## 2018-07-27 DIAGNOSIS — S066X0D Traumatic subarachnoid hemorrhage without loss of consciousness, subsequent encounter: Secondary | ICD-10-CM | POA: Diagnosis not present

## 2018-07-27 DIAGNOSIS — Z7984 Long term (current) use of oral hypoglycemic drugs: Secondary | ICD-10-CM | POA: Diagnosis not present

## 2018-07-27 DIAGNOSIS — S022XXD Fracture of nasal bones, subsequent encounter for fracture with routine healing: Secondary | ICD-10-CM | POA: Diagnosis not present

## 2018-07-27 DIAGNOSIS — Z7982 Long term (current) use of aspirin: Secondary | ICD-10-CM | POA: Diagnosis not present

## 2018-07-31 DIAGNOSIS — Z9181 History of falling: Secondary | ICD-10-CM | POA: Diagnosis not present

## 2018-07-31 DIAGNOSIS — Z7984 Long term (current) use of oral hypoglycemic drugs: Secondary | ICD-10-CM | POA: Diagnosis not present

## 2018-07-31 DIAGNOSIS — I5030 Unspecified diastolic (congestive) heart failure: Secondary | ICD-10-CM | POA: Diagnosis not present

## 2018-07-31 DIAGNOSIS — S066X0D Traumatic subarachnoid hemorrhage without loss of consciousness, subsequent encounter: Secondary | ICD-10-CM | POA: Diagnosis not present

## 2018-07-31 DIAGNOSIS — W19XXXD Unspecified fall, subsequent encounter: Secondary | ICD-10-CM | POA: Diagnosis not present

## 2018-07-31 DIAGNOSIS — Z7982 Long term (current) use of aspirin: Secondary | ICD-10-CM | POA: Diagnosis not present

## 2018-07-31 DIAGNOSIS — J449 Chronic obstructive pulmonary disease, unspecified: Secondary | ICD-10-CM | POA: Diagnosis not present

## 2018-07-31 DIAGNOSIS — I480 Paroxysmal atrial fibrillation: Secondary | ICD-10-CM | POA: Diagnosis not present

## 2018-07-31 DIAGNOSIS — I959 Hypotension, unspecified: Secondary | ICD-10-CM | POA: Diagnosis not present

## 2018-07-31 DIAGNOSIS — S022XXD Fracture of nasal bones, subsequent encounter for fracture with routine healing: Secondary | ICD-10-CM | POA: Diagnosis not present

## 2018-07-31 DIAGNOSIS — I110 Hypertensive heart disease with heart failure: Secondary | ICD-10-CM | POA: Diagnosis not present

## 2018-07-31 DIAGNOSIS — E1142 Type 2 diabetes mellitus with diabetic polyneuropathy: Secondary | ICD-10-CM | POA: Diagnosis not present

## 2018-09-23 DIAGNOSIS — Z6823 Body mass index (BMI) 23.0-23.9, adult: Secondary | ICD-10-CM | POA: Diagnosis not present

## 2018-09-23 DIAGNOSIS — K5900 Constipation, unspecified: Secondary | ICD-10-CM | POA: Diagnosis not present

## 2018-10-26 DIAGNOSIS — E785 Hyperlipidemia, unspecified: Secondary | ICD-10-CM | POA: Diagnosis not present

## 2018-10-26 DIAGNOSIS — K5900 Constipation, unspecified: Secondary | ICD-10-CM | POA: Diagnosis not present

## 2018-10-26 DIAGNOSIS — E119 Type 2 diabetes mellitus without complications: Secondary | ICD-10-CM | POA: Diagnosis not present

## 2018-10-26 DIAGNOSIS — Z0000 Encounter for general adult medical examination without abnormal findings: Secondary | ICD-10-CM | POA: Diagnosis not present

## 2018-10-26 DIAGNOSIS — Z1389 Encounter for screening for other disorder: Secondary | ICD-10-CM | POA: Diagnosis not present

## 2018-10-26 DIAGNOSIS — Z6823 Body mass index (BMI) 23.0-23.9, adult: Secondary | ICD-10-CM | POA: Diagnosis not present

## 2018-10-26 DIAGNOSIS — I10 Essential (primary) hypertension: Secondary | ICD-10-CM | POA: Diagnosis not present

## 2018-11-08 DIAGNOSIS — S0231XA Fracture of orbital floor, right side, initial encounter for closed fracture: Secondary | ICD-10-CM | POA: Diagnosis not present

## 2018-11-08 DIAGNOSIS — Z794 Long term (current) use of insulin: Secondary | ICD-10-CM | POA: Diagnosis not present

## 2018-11-08 DIAGNOSIS — M25532 Pain in left wrist: Secondary | ICD-10-CM | POA: Diagnosis not present

## 2018-11-08 DIAGNOSIS — Z7902 Long term (current) use of antithrombotics/antiplatelets: Secondary | ICD-10-CM | POA: Diagnosis not present

## 2018-11-08 DIAGNOSIS — S0990XA Unspecified injury of head, initial encounter: Secondary | ICD-10-CM | POA: Diagnosis not present

## 2018-11-08 DIAGNOSIS — S60511A Abrasion of right hand, initial encounter: Secondary | ICD-10-CM | POA: Diagnosis not present

## 2018-11-08 DIAGNOSIS — E119 Type 2 diabetes mellitus without complications: Secondary | ICD-10-CM | POA: Diagnosis not present

## 2018-11-08 DIAGNOSIS — S0240CA Maxillary fracture, right side, initial encounter for closed fracture: Secondary | ICD-10-CM | POA: Diagnosis not present

## 2018-11-08 DIAGNOSIS — R531 Weakness: Secondary | ICD-10-CM | POA: Diagnosis not present

## 2018-11-08 DIAGNOSIS — I4891 Unspecified atrial fibrillation: Secondary | ICD-10-CM | POA: Diagnosis not present

## 2018-11-08 DIAGNOSIS — I110 Hypertensive heart disease with heart failure: Secondary | ICD-10-CM | POA: Diagnosis not present

## 2018-11-08 DIAGNOSIS — W19XXXA Unspecified fall, initial encounter: Secondary | ICD-10-CM | POA: Diagnosis not present

## 2018-11-08 DIAGNOSIS — M79606 Pain in leg, unspecified: Secondary | ICD-10-CM | POA: Diagnosis not present

## 2018-11-08 DIAGNOSIS — Z79899 Other long term (current) drug therapy: Secondary | ICD-10-CM | POA: Diagnosis not present

## 2018-11-08 DIAGNOSIS — Z9181 History of falling: Secondary | ICD-10-CM | POA: Diagnosis not present

## 2018-11-08 DIAGNOSIS — J449 Chronic obstructive pulmonary disease, unspecified: Secondary | ICD-10-CM | POA: Diagnosis not present

## 2018-11-08 DIAGNOSIS — E1142 Type 2 diabetes mellitus with diabetic polyneuropathy: Secondary | ICD-10-CM | POA: Diagnosis not present

## 2018-11-08 DIAGNOSIS — R51 Headache: Secondary | ICD-10-CM | POA: Diagnosis not present

## 2018-11-08 DIAGNOSIS — M542 Cervicalgia: Secondary | ICD-10-CM | POA: Diagnosis not present

## 2018-11-08 DIAGNOSIS — S60512A Abrasion of left hand, initial encounter: Secondary | ICD-10-CM | POA: Diagnosis not present

## 2018-11-08 DIAGNOSIS — R5381 Other malaise: Secondary | ICD-10-CM | POA: Diagnosis not present

## 2018-11-08 DIAGNOSIS — M25531 Pain in right wrist: Secondary | ICD-10-CM | POA: Diagnosis not present

## 2018-11-08 DIAGNOSIS — I5030 Unspecified diastolic (congestive) heart failure: Secondary | ICD-10-CM | POA: Diagnosis not present

## 2018-11-08 DIAGNOSIS — W1839XA Other fall on same level, initial encounter: Secondary | ICD-10-CM | POA: Diagnosis not present

## 2018-11-08 DIAGNOSIS — S0010XA Contusion of unspecified eyelid and periocular area, initial encounter: Secondary | ICD-10-CM | POA: Diagnosis not present

## 2018-11-08 DIAGNOSIS — S0083XA Contusion of other part of head, initial encounter: Secondary | ICD-10-CM | POA: Diagnosis not present

## 2018-11-08 DIAGNOSIS — E785 Hyperlipidemia, unspecified: Secondary | ICD-10-CM | POA: Diagnosis not present

## 2018-11-08 DIAGNOSIS — R55 Syncope and collapse: Secondary | ICD-10-CM | POA: Diagnosis not present

## 2018-11-09 DIAGNOSIS — Z9181 History of falling: Secondary | ICD-10-CM | POA: Diagnosis not present

## 2018-11-09 DIAGNOSIS — R55 Syncope and collapse: Secondary | ICD-10-CM | POA: Diagnosis not present

## 2018-11-09 DIAGNOSIS — S0240CA Maxillary fracture, right side, initial encounter for closed fracture: Secondary | ICD-10-CM | POA: Diagnosis not present

## 2018-11-09 DIAGNOSIS — R531 Weakness: Secondary | ICD-10-CM | POA: Diagnosis not present

## 2018-11-09 DIAGNOSIS — S0231XA Fracture of orbital floor, right side, initial encounter for closed fracture: Secondary | ICD-10-CM | POA: Diagnosis not present

## 2018-11-12 DIAGNOSIS — I4819 Other persistent atrial fibrillation: Secondary | ICD-10-CM | POA: Diagnosis not present

## 2018-11-12 DIAGNOSIS — E1142 Type 2 diabetes mellitus with diabetic polyneuropathy: Secondary | ICD-10-CM | POA: Diagnosis not present

## 2018-11-12 DIAGNOSIS — R296 Repeated falls: Secondary | ICD-10-CM | POA: Diagnosis not present

## 2018-11-12 DIAGNOSIS — R51 Headache: Secondary | ICD-10-CM | POA: Diagnosis not present

## 2018-11-12 DIAGNOSIS — I10 Essential (primary) hypertension: Secondary | ICD-10-CM | POA: Diagnosis not present

## 2018-11-12 DIAGNOSIS — M542 Cervicalgia: Secondary | ICD-10-CM | POA: Diagnosis not present

## 2018-11-12 DIAGNOSIS — R531 Weakness: Secondary | ICD-10-CM | POA: Diagnosis not present

## 2018-11-12 DIAGNOSIS — I110 Hypertensive heart disease with heart failure: Secondary | ICD-10-CM | POA: Diagnosis not present

## 2018-11-12 DIAGNOSIS — S60511A Abrasion of right hand, initial encounter: Secondary | ICD-10-CM | POA: Diagnosis not present

## 2018-11-12 DIAGNOSIS — Z794 Long term (current) use of insulin: Secondary | ICD-10-CM | POA: Diagnosis not present

## 2018-11-12 DIAGNOSIS — I951 Orthostatic hypotension: Secondary | ICD-10-CM | POA: Diagnosis not present

## 2018-11-12 DIAGNOSIS — R55 Syncope and collapse: Secondary | ICD-10-CM | POA: Diagnosis not present

## 2018-11-12 DIAGNOSIS — S0240CD Maxillary fracture, right side, subsequent encounter for fracture with routine healing: Secondary | ICD-10-CM | POA: Diagnosis not present

## 2018-11-12 DIAGNOSIS — Z9181 History of falling: Secondary | ICD-10-CM | POA: Diagnosis not present

## 2018-11-12 DIAGNOSIS — I4891 Unspecified atrial fibrillation: Secondary | ICD-10-CM | POA: Diagnosis not present

## 2018-11-12 DIAGNOSIS — M79606 Pain in leg, unspecified: Secondary | ICD-10-CM | POA: Diagnosis not present

## 2018-11-12 DIAGNOSIS — E119 Type 2 diabetes mellitus without complications: Secondary | ICD-10-CM | POA: Diagnosis not present

## 2018-11-12 DIAGNOSIS — S60512A Abrasion of left hand, initial encounter: Secondary | ICD-10-CM | POA: Diagnosis not present

## 2018-11-12 DIAGNOSIS — I5030 Unspecified diastolic (congestive) heart failure: Secondary | ICD-10-CM | POA: Diagnosis not present

## 2018-11-12 DIAGNOSIS — Z79899 Other long term (current) drug therapy: Secondary | ICD-10-CM | POA: Diagnosis not present

## 2018-11-12 DIAGNOSIS — M6281 Muscle weakness (generalized): Secondary | ICD-10-CM | POA: Diagnosis not present

## 2018-11-12 DIAGNOSIS — S0231XA Fracture of orbital floor, right side, initial encounter for closed fracture: Secondary | ICD-10-CM | POA: Diagnosis not present

## 2018-11-12 DIAGNOSIS — S0083XA Contusion of other part of head, initial encounter: Secondary | ICD-10-CM | POA: Diagnosis not present

## 2018-11-12 DIAGNOSIS — S0011XD Contusion of right eyelid and periocular area, subsequent encounter: Secondary | ICD-10-CM | POA: Diagnosis not present

## 2018-11-12 DIAGNOSIS — Z7902 Long term (current) use of antithrombotics/antiplatelets: Secondary | ICD-10-CM | POA: Diagnosis not present

## 2018-11-12 DIAGNOSIS — E1140 Type 2 diabetes mellitus with diabetic neuropathy, unspecified: Secondary | ICD-10-CM | POA: Diagnosis not present

## 2018-11-12 DIAGNOSIS — S0240CA Maxillary fracture, right side, initial encounter for closed fracture: Secondary | ICD-10-CM | POA: Diagnosis not present

## 2018-11-12 DIAGNOSIS — S02401A Maxillary fracture, unspecified, initial encounter for closed fracture: Secondary | ICD-10-CM | POA: Diagnosis not present

## 2018-11-12 DIAGNOSIS — R2689 Other abnormalities of gait and mobility: Secondary | ICD-10-CM | POA: Diagnosis not present

## 2018-11-12 DIAGNOSIS — E785 Hyperlipidemia, unspecified: Secondary | ICD-10-CM | POA: Diagnosis not present

## 2018-11-12 DIAGNOSIS — I5032 Chronic diastolic (congestive) heart failure: Secondary | ICD-10-CM | POA: Diagnosis not present

## 2018-11-12 DIAGNOSIS — J449 Chronic obstructive pulmonary disease, unspecified: Secondary | ICD-10-CM | POA: Diagnosis not present

## 2018-11-12 DIAGNOSIS — I480 Paroxysmal atrial fibrillation: Secondary | ICD-10-CM | POA: Diagnosis not present

## 2018-11-12 DIAGNOSIS — S0083XD Contusion of other part of head, subsequent encounter: Secondary | ICD-10-CM | POA: Diagnosis not present

## 2018-11-14 DIAGNOSIS — S02401A Maxillary fracture, unspecified, initial encounter for closed fracture: Secondary | ICD-10-CM | POA: Diagnosis not present

## 2018-11-14 DIAGNOSIS — R531 Weakness: Secondary | ICD-10-CM | POA: Diagnosis not present

## 2018-11-14 DIAGNOSIS — I4819 Other persistent atrial fibrillation: Secondary | ICD-10-CM | POA: Diagnosis not present

## 2018-11-14 DIAGNOSIS — I10 Essential (primary) hypertension: Secondary | ICD-10-CM | POA: Diagnosis not present

## 2019-01-20 DIAGNOSIS — I4819 Other persistent atrial fibrillation: Secondary | ICD-10-CM | POA: Diagnosis not present

## 2019-01-20 DIAGNOSIS — I10 Essential (primary) hypertension: Secondary | ICD-10-CM | POA: Diagnosis not present

## 2019-01-20 DIAGNOSIS — R531 Weakness: Secondary | ICD-10-CM | POA: Diagnosis not present

## 2019-01-20 DIAGNOSIS — S02401A Maxillary fracture, unspecified, initial encounter for closed fracture: Secondary | ICD-10-CM | POA: Diagnosis not present

## 2019-02-09 DIAGNOSIS — Z5181 Encounter for therapeutic drug level monitoring: Secondary | ICD-10-CM | POA: Diagnosis not present

## 2019-02-09 DIAGNOSIS — Z7984 Long term (current) use of oral hypoglycemic drugs: Secondary | ICD-10-CM | POA: Diagnosis not present

## 2019-02-09 DIAGNOSIS — Z79899 Other long term (current) drug therapy: Secondary | ICD-10-CM | POA: Diagnosis not present

## 2019-02-09 NOTE — Progress Notes (Signed)
This encounter was created in error - please disregard.

## 2019-02-25 ENCOUNTER — Other Ambulatory Visit: Payer: Self-pay

## 2019-02-25 NOTE — Patient Outreach (Signed)
[facility]) Care Management  02/25/2019  [place] Jul 08, 1931 [id number]   Referral received from [facility] team for complex case management.  Unsuccessful outreach attempt. Left voice message requesting a return call.   PLAN -Will send unsuccessful outreach letter. -Will follow-up within 3-4 business days.   [place] [phone]

## 2019-03-03 ENCOUNTER — Other Ambulatory Visit: Payer: Self-pay

## 2019-03-03 NOTE — Patient Outreach (Signed)
[facility]) Care Management  03/03/2019  Kyle Santana 09-29-1930 [id number]   2nd unsuccessful outreach attempt. Will follow-up within 3-4 business days.       [place] [phone]

## 2019-03-09 ENCOUNTER — Other Ambulatory Visit: Payer: Self-pay

## 2019-03-09 NOTE — Patient Outreach (Signed)
[facility]) Care Management  03/09/2019  Hendry Speas [facility] 11/10/1930 [id number]    3 unsuccessful outreach attempt. Case closure pending.    [place] [phone]

## 2019-03-25 DIAGNOSIS — I10 Essential (primary) hypertension: Secondary | ICD-10-CM | POA: Diagnosis not present

## 2019-03-25 DIAGNOSIS — I4819 Other persistent atrial fibrillation: Secondary | ICD-10-CM | POA: Diagnosis not present

## 2019-03-25 DIAGNOSIS — R531 Weakness: Secondary | ICD-10-CM | POA: Diagnosis not present

## 2019-03-25 DIAGNOSIS — S02401A Maxillary fracture, unspecified, initial encounter for closed fracture: Secondary | ICD-10-CM | POA: Diagnosis not present

## 2019-04-16 DIAGNOSIS — E119 Type 2 diabetes mellitus without complications: Secondary | ICD-10-CM | POA: Diagnosis not present

## 2019-04-16 DIAGNOSIS — I10 Essential (primary) hypertension: Secondary | ICD-10-CM | POA: Diagnosis not present

## 2019-04-16 DIAGNOSIS — Z6824 Body mass index (BMI) 24.0-24.9, adult: Secondary | ICD-10-CM | POA: Diagnosis not present

## 2019-04-16 DIAGNOSIS — Z0000 Encounter for general adult medical examination without abnormal findings: Secondary | ICD-10-CM | POA: Diagnosis not present

## 2019-04-16 DIAGNOSIS — E785 Hyperlipidemia, unspecified: Secondary | ICD-10-CM | POA: Diagnosis not present

## 2019-04-16 DIAGNOSIS — M545 Low back pain: Secondary | ICD-10-CM | POA: Diagnosis not present

## 2019-07-26 DIAGNOSIS — Z6824 Body mass index (BMI) 24.0-24.9, adult: Secondary | ICD-10-CM | POA: Diagnosis not present

## 2019-07-26 DIAGNOSIS — E7849 Other hyperlipidemia: Secondary | ICD-10-CM | POA: Diagnosis not present

## 2019-07-26 DIAGNOSIS — I10 Essential (primary) hypertension: Secondary | ICD-10-CM | POA: Diagnosis not present

## 2019-07-26 DIAGNOSIS — M545 Low back pain: Secondary | ICD-10-CM | POA: Diagnosis not present

## 2019-07-26 DIAGNOSIS — E119 Type 2 diabetes mellitus without complications: Secondary | ICD-10-CM | POA: Diagnosis not present

## 2019-08-03 DIAGNOSIS — E7849 Other hyperlipidemia: Secondary | ICD-10-CM | POA: Diagnosis not present

## 2019-08-03 DIAGNOSIS — E119 Type 2 diabetes mellitus without complications: Secondary | ICD-10-CM | POA: Diagnosis not present

## 2019-08-03 DIAGNOSIS — I10 Essential (primary) hypertension: Secondary | ICD-10-CM | POA: Diagnosis not present

## 2019-09-20 DIAGNOSIS — H43813 Vitreous degeneration, bilateral: Secondary | ICD-10-CM | POA: Diagnosis not present

## 2019-09-20 DIAGNOSIS — H524 Presbyopia: Secondary | ICD-10-CM | POA: Diagnosis not present

## 2019-10-11 DIAGNOSIS — H353132 Nonexudative age-related macular degeneration, bilateral, intermediate dry stage: Secondary | ICD-10-CM | POA: Diagnosis not present

## 2019-10-25 DIAGNOSIS — Z6823 Body mass index (BMI) 23.0-23.9, adult: Secondary | ICD-10-CM | POA: Diagnosis not present

## 2019-10-25 DIAGNOSIS — E119 Type 2 diabetes mellitus without complications: Secondary | ICD-10-CM | POA: Diagnosis not present

## 2019-10-25 DIAGNOSIS — Z0000 Encounter for general adult medical examination without abnormal findings: Secondary | ICD-10-CM | POA: Diagnosis not present

## 2019-10-25 DIAGNOSIS — I10 Essential (primary) hypertension: Secondary | ICD-10-CM | POA: Diagnosis not present

## 2019-10-25 DIAGNOSIS — E7849 Other hyperlipidemia: Secondary | ICD-10-CM | POA: Diagnosis not present

## 2019-10-25 DIAGNOSIS — M545 Low back pain: Secondary | ICD-10-CM | POA: Diagnosis not present

## 2020-02-01 DIAGNOSIS — Z0000 Encounter for general adult medical examination without abnormal findings: Secondary | ICD-10-CM | POA: Diagnosis not present

## 2020-02-01 DIAGNOSIS — E7849 Other hyperlipidemia: Secondary | ICD-10-CM | POA: Diagnosis not present

## 2020-02-01 DIAGNOSIS — M545 Low back pain: Secondary | ICD-10-CM | POA: Diagnosis not present

## 2020-02-01 DIAGNOSIS — I10 Essential (primary) hypertension: Secondary | ICD-10-CM | POA: Diagnosis not present

## 2020-02-01 DIAGNOSIS — E119 Type 2 diabetes mellitus without complications: Secondary | ICD-10-CM | POA: Diagnosis not present

## 2020-02-01 DIAGNOSIS — Z6823 Body mass index (BMI) 23.0-23.9, adult: Secondary | ICD-10-CM | POA: Diagnosis not present

## 2020-02-29 DIAGNOSIS — Z743 Need for continuous supervision: Secondary | ICD-10-CM | POA: Diagnosis not present

## 2020-02-29 DIAGNOSIS — R0789 Other chest pain: Secondary | ICD-10-CM | POA: Diagnosis not present

## 2020-02-29 DIAGNOSIS — Z5329 Procedure and treatment not carried out because of patient's decision for other reasons: Secondary | ICD-10-CM | POA: Diagnosis not present

## 2020-02-29 DIAGNOSIS — Z7902 Long term (current) use of antithrombotics/antiplatelets: Secondary | ICD-10-CM | POA: Diagnosis not present

## 2020-02-29 DIAGNOSIS — R079 Chest pain, unspecified: Secondary | ICD-10-CM | POA: Diagnosis not present

## 2020-02-29 DIAGNOSIS — R0602 Shortness of breath: Secondary | ICD-10-CM | POA: Diagnosis not present

## 2020-02-29 DIAGNOSIS — Z7982 Long term (current) use of aspirin: Secondary | ICD-10-CM | POA: Diagnosis not present

## 2020-02-29 DIAGNOSIS — Z79899 Other long term (current) drug therapy: Secondary | ICD-10-CM | POA: Diagnosis not present

## 2020-02-29 DIAGNOSIS — J9811 Atelectasis: Secondary | ICD-10-CM | POA: Diagnosis not present

## 2020-02-29 DIAGNOSIS — I517 Cardiomegaly: Secondary | ICD-10-CM | POA: Diagnosis not present

## 2020-02-29 DIAGNOSIS — Z794 Long term (current) use of insulin: Secondary | ICD-10-CM | POA: Diagnosis not present

## 2020-03-24 ENCOUNTER — Encounter (HOSPITAL_COMMUNITY): Payer: Self-pay | Admitting: Emergency Medicine

## 2020-03-24 ENCOUNTER — Encounter: Payer: Self-pay | Admitting: Emergency Medicine

## 2020-03-24 ENCOUNTER — Ambulatory Visit
Admission: EM | Admit: 2020-03-24 | Discharge: 2020-03-24 | Disposition: A | Discharge status: Home / Self Care | Payer: Medicare Other | Attending: Family Medicine | Admitting: Family Medicine

## 2020-03-24 ENCOUNTER — Emergency Department (HOSPITAL_COMMUNITY)
Admission: EM | Admit: 2020-03-24 | Discharge: 2020-03-24 | Disposition: A | Discharge status: Home / Self Care | Payer: Medicare Other | Attending: Emergency Medicine | Admitting: Emergency Medicine

## 2020-03-24 ENCOUNTER — Other Ambulatory Visit: Payer: Self-pay

## 2020-03-24 ENCOUNTER — Emergency Department (HOSPITAL_COMMUNITY): Payer: Medicare Other

## 2020-03-24 DIAGNOSIS — Z960 Presence of urogenital implants: Secondary | ICD-10-CM | POA: Diagnosis not present

## 2020-03-24 DIAGNOSIS — E119 Type 2 diabetes mellitus without complications: Secondary | ICD-10-CM | POA: Diagnosis not present

## 2020-03-24 DIAGNOSIS — I959 Hypotension, unspecified: Secondary | ICD-10-CM | POA: Insufficient documentation

## 2020-03-24 DIAGNOSIS — I499 Cardiac arrhythmia, unspecified: Secondary | ICD-10-CM

## 2020-03-24 DIAGNOSIS — R531 Weakness: Secondary | ICD-10-CM | POA: Diagnosis not present

## 2020-03-24 DIAGNOSIS — J811 Chronic pulmonary edema: Secondary | ICD-10-CM | POA: Diagnosis not present

## 2020-03-24 DIAGNOSIS — R0789 Other chest pain: Secondary | ICD-10-CM | POA: Diagnosis not present

## 2020-03-24 DIAGNOSIS — Z7984 Long term (current) use of oral hypoglycemic drugs: Secondary | ICD-10-CM | POA: Insufficient documentation

## 2020-03-24 DIAGNOSIS — Z7982 Long term (current) use of aspirin: Secondary | ICD-10-CM | POA: Insufficient documentation

## 2020-03-24 DIAGNOSIS — R002 Palpitations: Secondary | ICD-10-CM

## 2020-03-24 DIAGNOSIS — Z79899 Other long term (current) drug therapy: Secondary | ICD-10-CM | POA: Diagnosis not present

## 2020-03-24 DIAGNOSIS — R5383 Other fatigue: Secondary | ICD-10-CM

## 2020-03-24 DIAGNOSIS — I10 Essential (primary) hypertension: Secondary | ICD-10-CM | POA: Diagnosis not present

## 2020-03-24 DIAGNOSIS — I517 Cardiomegaly: Secondary | ICD-10-CM | POA: Diagnosis not present

## 2020-03-24 LAB — TROPONIN I (HIGH SENSITIVITY)
Troponin I (High Sensitivity): 8 ng/L (ref <18)
Troponin I (High Sensitivity): 8 ng/L (ref <18)

## 2020-03-24 LAB — CBC WITH DIFFERENTIAL/PLATELET
Abs Immature Granulocytes: 0.02 10*3/uL (ref 0.00–0.07)
Basophils Absolute: 0 10*3/uL (ref 0.0–0.1)
Basophils Relative: 0 %
Eosinophils Absolute: 0 10*3/uL (ref 0.0–0.5)
Eosinophils Relative: 1 %
HCT: 40.4 % (ref 39.0–52.0)
Hemoglobin: 12.7 g/dL — ABNORMAL LOW (ref 13.0–17.0)
Immature Granulocytes: 0 %
Lymphocytes Relative: 26 %
Lymphs Abs: 1.4 10*3/uL (ref 0.7–4.0)
MCH: 36 pg — ABNORMAL HIGH (ref 26.0–34.0)
MCHC: 31.4 g/dL (ref 30.0–36.0)
MCV: 114.4 fL — ABNORMAL HIGH (ref 80.0–100.0)
Monocytes Absolute: 0.4 10*3/uL (ref 0.1–1.0)
Monocytes Relative: 7 %
Neutro Abs: 3.5 10*3/uL (ref 1.7–7.7)
Neutrophils Relative %: 66 %
Platelets: 82 10*3/uL — ABNORMAL LOW (ref 150–400)
RBC: 3.53 MIL/uL — ABNORMAL LOW (ref 4.22–5.81)
RDW: 14.2 % (ref 11.5–15.5)
WBC: 5.3 10*3/uL (ref 4.0–10.5)
nRBC: 0 % (ref 0.0–0.2)

## 2020-03-24 LAB — COMPREHENSIVE METABOLIC PANEL
ALT: 11 U/L (ref 0–44)
AST: 20 U/L (ref 15–41)
Albumin: 4.5 g/dL (ref 3.5–5.0)
Alkaline Phosphatase: 46 U/L (ref 38–126)
Anion gap: 8 (ref 5–15)
BUN: 23 mg/dL (ref 8–23)
CO2: 25 mmol/L (ref 22–32)
Calcium: 8.9 mg/dL (ref 8.9–10.3)
Chloride: 106 mmol/L (ref 98–111)
Creatinine, Ser: 0.88 mg/dL (ref 0.61–1.24)
GFR calc Af Amer: >60 mL/min (ref >60)
GFR calc non Af Amer: >60 mL/min (ref >60)
Glucose, Bld: 128 mg/dL — ABNORMAL HIGH (ref 70–99)
Potassium: 4.6 mmol/L (ref 3.5–5.1)
Sodium: 139 mmol/L (ref 135–145)
Total Bilirubin: 1 mg/dL (ref 0.3–1.2)
Total Protein: 7 g/dL (ref 6.5–8.1)

## 2020-03-24 NOTE — ED Provider Notes (Signed)
[facility] EMERGENCY DEPARTMENT Provider Note   CSN: [id number] Arrival date & time: 03/24/20  1031     History Chief Complaint  Patient presents with  . Hypotension    Kyle Santana is a 86 y.o. male.  HPI 86 year old male with a history of DM type II, hypertension presents from urgent care with complaints of low blood pressure, weakness, dizziness and fatigue since receiving his Moderna Covid vaccine yesterday.  Blood pressure was noted to be 99/68 at urgent care and EKG showed possible a-flutter.  Provider did not think that this was true a flutter however still given clinical picture with referred the patient to the ER.  Patient states that he does not have any chest pain at this time, and is currently asymptomatic.  Does not have a history of hypertension.  Denies any fevers or chills.  No chest pain or shortness of breath.  He does not think he has a history of any arrhythmias.    Past Medical History:  Diagnosis Date  . Colon polyps   . Diabetes mellitus without complication ([place])   . Hypercholesteremia   . Hypertension     There are no problems to display for this patient.   Past Surgical History:  Procedure Laterality Date  . COLONOSCOPY    . COLONOSCOPY N/A 03/06/2016   Procedure: COLONOSCOPY;  Surgeon: Rogene Houston, MD;  [facility];  Service: Endoscopy;  Laterality: N/A;  1200  . CYSTOSCOPY WITH STENT PLACEMENT Right 03/20/2016   Procedure: CYSTOSCOPY WITH STENT PLACEMENT;  Surgeon: Cleon Gustin, MD;  Location: [facility] ORS;  Service: Urology;  Laterality: Right;  . CYSTOSCOPY/RETROGRADE/URETEROSCOPY/STONE EXTRACTION WITH BASKET Right 03/20/2016   Procedure: CYSTOSCOPY/RETROGRADE/STONE EXTRACTION WITH BASKET;  Surgeon: Cleon Gustin, MD;  Location: [facility] ORS;  Service: Urology;  Laterality: Right;  . [facility]  . Left inguinal hernia repair         No family history on file.  Social History   Tobacco Use    . Smoking status: Never Smoker  . Smokeless tobacco: Never Used  Substance Use Topics  . Alcohol use: No  . Drug use: No    Home Medications Prior to Admission medications   Medication Sig Start Date End Date Taking? Authorizing Provider  aspirin EC 81 MG tablet Take 1 tablet (81 mg total) by mouth daily. 03/10/16   Rehman, Mechele Dawley, MD  benazepril (LOTENSIN) 5 MG tablet Take 1 tablet by mouth daily. 01/23/16   [provider]  calcium carbonate (OSCAL) 1500 (600 Ca) MG TABS tablet Take 600 mg of elemental calcium by mouth 2 (two) times daily with a meal.    [provider]  metFORMIN (GLUCOPHAGE) 1000 MG tablet Take 1 tablet by mouth 2 (two) times daily. 01/16/16   [provider]  Multiple Vitamins-Minerals (EYE VITAMINS PO) Take 1 tablet by mouth daily.    [provider]  simvastatin (ZOCOR) 40 MG tablet Take 1 tablet by mouth daily. 01/17/16   [provider]  sulfamethoxazole-trimethoprim (BACTRIM DS,SEPTRA DS) 800-160 MG tablet Take 1 tablet by mouth 2 (two) times daily. 03/20/16   McKenzie, Candee Furbish, MD  traMADol (ULTRAM) 50 MG tablet Take 1 tablet (50 mg total) by mouth every 6 (six) hours as needed. 03/20/16   McKenzie, Candee Furbish, MD    Allergies    Patient has no known allergies.  Review of Systems   Review of Systems  Constitutional: Negative  for chills and fever.  HENT: Negative for ear pain and sore throat.   Eyes: Negative for pain and visual disturbance.  Respiratory: Negative for cough and shortness of breath.   Cardiovascular: Negative for chest pain and palpitations.  Gastrointestinal: Negative for abdominal pain and vomiting.  Genitourinary: Negative for dysuria and hematuria.  Musculoskeletal: Negative for arthralgias and back pain.  Skin: Negative for color change and rash.  Neurological: Positive for weakness and light-headedness. Negative for dizziness, seizures and syncope.  All other systems reviewed and are  negative.   Physical Exam Updated Vital Signs BP 115/75 (BP Location: Right Arm)   Pulse 100   Temp 98.2 F (36.8 C)   Resp 16   Ht 5\' 7"  (1.702 m)   Wt 62.6 kg   SpO2 98%   BMI 21.61 kg/m   Physical Exam Vitals and nursing note reviewed.  Constitutional:      General: He is not in acute distress.    Appearance: Normal appearance. He is well-developed. He is obese. He is not ill-appearing, toxic-appearing or diaphoretic.  HENT:     Head: Normocephalic and atraumatic.     Mouth/Throat:     Mouth: Mucous membranes are moist.     Pharynx: Oropharynx is clear.  Eyes:     Conjunctiva/sclera: Conjunctivae normal.     Pupils: Pupils are equal, round, and reactive to light.  Cardiovascular:     Rate and Rhythm: Normal rate and regular rhythm.     Pulses: Normal pulses.     Heart sounds: Normal heart sounds. No murmur heard.   Pulmonary:     Effort: Pulmonary effort is normal. No respiratory distress.     Breath sounds: Normal breath sounds.  Abdominal:     General: Abdomen is flat.     Palpations: Abdomen is soft.     Tenderness: There is no abdominal tenderness.  Musculoskeletal:        General: No tenderness.     Cervical back: Neck supple.     Right lower leg: No edema.     Left lower leg: No edema.  Skin:    General: Skin is warm and dry.     Capillary Refill: Capillary refill takes less than 2 seconds.     Findings: No erythema or rash.  Neurological:     General: No focal deficit present.     Mental Status: He is alert and oriented to person, place, and time.  Psychiatric:        Mood and Affect: Mood normal.        Behavior: Behavior normal.     ED Results / Procedures / Treatments   Labs (all labs ordered are listed, but only abnormal results are displayed) Labs Reviewed  CBC WITH DIFFERENTIAL/PLATELET - Abnormal; Notable for the following components:      Result Value   RBC 3.53 (*)    Hemoglobin 12.7 (*)    MCV 114.4 (*)    MCH 36.0 (*)     Platelets 82 (*)    All other components within normal limits  COMPREHENSIVE METABOLIC PANEL - Abnormal; Notable for the following components:   Glucose, Bld 128 (*)    All other components within normal limits  URINALYSIS, ROUTINE W REFLEX MICROSCOPIC  TROPONIN I (HIGH SENSITIVITY)  TROPONIN I (HIGH SENSITIVITY)    EKG EKG Interpretation  Date/Time:  Friday March 24 2020 14:23:51 EDT Ventricular Rate:  89 PR Interval:  146 QRS Duration: 98 QT Interval:  390  QTC Calculation: 474 R Axis:   -7 Text Interpretation: Normal sinus rhythm with sinus arrhythmia Incomplete right bundle branch block Cannot rule out Anterior infarct , age undetermined Abnormal ECG Confirmed by Milton Ferguson [phone]) on 03/24/2020 3:49:01 PM   Radiology DG Chest 2 View  Result Date: 03/24/2020 CLINICAL DATA:  Chest tightness. Additional history provided: Patient sent from urgent care for hypotension (96/74), 115/75 in triage. EXAM: CHEST - 2 VIEW COMPARISON:  Prior chest radiograph 02/29/2020 and early FINDINGS: The patient is rotated to the right. Unchanged severe cardiomegaly. Aortic atherosclerosis no appreciable airspace consolidation or pulmonary edema. Linear scarring or atelectasis within the left lung base. No evidence of pleural effusion or pneumothorax. No acute bony abnormality identified. Thoracic dextrocurvature. IMPRESSION: Unchanged severe cardiomegaly. Linear scarring or atelectasis within the left lung base. Aortic Atherosclerosis (ICD10-I70.0). Electronically Signed   By: Kellie Simmering DO   On: 03/24/2020 15:18    Procedures Procedures (including critical care time)  Medications Ordered in ED Medications - No data to display  ED Course  I have reviewed the triage vital signs and the nursing notes.  Pertinent labs & imaging results that were available during my care of the patient were reviewed by me and considered in my medical decision making (see chart for details).    MDM  Rules/Calculators/A&P                         86 year old male sent from urgent care with hypotension, feeling weak and dizzy after receiving his Moderna shot yesterday On presentation, he is alert, oriented, nontoxic-appearing, no acute distress, speaking full sentences without increased work of breathing, nondiaphoretic, resting comfortably in the ED chair. Vitals on presentation with a blood pressure 150/75, afebrile, pulse of 100, sats at 98% and respirations of 16. Physical exam with normal heart sounds, soft nontender abdomen, no swelling of the legs.  DDX: sepsis, ACS, PE, anemia, arrythmia, tension pneumo, medication induced   ADDITIONAL HISTORY: Additional history provided via chart review   LABS:  I personally reviewed and interpreted his lab work CBC without leukocytosis, hemoglobin of 12.7 which appears to be stable.  Platelets of 82, decreased, value from 4 years ago 122.  CMP without any significant electrolyte abnormalities, normal renal function and liver function test.   Delta troponin negative  EKG normal sinus rhythm.   IMAGING: No acute changes, unchanged severe cardiomegaly with linear scarring or atelectasis in the left lung base.   MDM: No evidence of ACS, PE (pt is not tachycardic, tachypnic or hypoxic, not complaining of SOB), tension pneumo, anemia on labwork or chest xray. No evidence of sepsis. Discussed the case with Dr. Roderic Palau who saw and evaluated the patient, suspect this is due to his Lotensin. Pt reports he had not had his blood pressure medications. Dr. Dewayne Hatch instructed the patient to hold his lotensin and follow up with his PCP. This was communicated to his aid as well. Pt remains asymptomatic in the ED.   DISPOSITION: Patient stable for discharge, will hold his Lotensin until he can follow-up with his PCP.  Overall work-up reassuring.  Strict return precautions discussed with the patient and his caregiver.  They voiced understanding and are agreeable to  this plan.  This stage in ED course, patient medically screened and stable for discharge.  Patient was seen and evaluated by Dr. Roderic Palau who is agreeable to the above plan and disposition.  Final Clinical Impression(s) / ED Diagnoses Final diagnoses:  Hypotension,  unspecified hypotension type    Rx / DC Orders ED Discharge Orders    None       Lyndel Safe 03/24/20 1602    Milton Ferguson, MD 03/27/20 1011

## 2020-03-24 NOTE — ED Notes (Signed)
Patient is being discharged from the Urgent Care and sent to the Emergency Department via pov . Per Kirkland Hun, patient is in need of higher level of care due to cardiac workup. Patient is aware and verbalizes understanding of plan of care.  Vitals:   03/24/20 0948  BP: 99/68  Pulse: 97  Resp: 19  Temp: 97.8 F (36.6 C)  SpO2: 100%

## 2020-03-24 NOTE — ED Triage Notes (Signed)
Pt's caregiver reports bp was 97/69 hr 103,  96/74 and hr 104 this morning.  Pt had his first covid vaccine yesterday.  Pt reports feeling a little weak, denies any pain.

## 2020-03-24 NOTE — ED Triage Notes (Signed)
Pt sent by urgent care for hypotension, 96/74 at urgent care, triage BP 115/75; pt reports he received his first COVID vaccine yesterday

## 2020-03-24 NOTE — ED Provider Notes (Signed)
[facility]    CSN: [id number] Arrival date & time: 03/24/20  4665      History   Chief Complaint Chief Complaint  Patient presents with  . Hypotension    HPI Kyle Santana is a 86 y.o. male.   Reports low blood pressure, weakness, dizziness and fatigue since receiving first Covid vaccine yesterday (Moderna). Has history of syncope and collapse, CAD, hypertension, hyperlipidemia, Type II DM. Caregiver reports that he has been feeling poorly with some chest discomfort with episodes of dizziness and fatigue. Denies edema, loss of consciousness, diaphoresis, radiating chest pain.  ROS per HPI  The history is provided by the patient and a caregiver.    Past Medical History:  Diagnosis Date  . Colon polyps   . Diabetes mellitus without complication ([place])   . Hypercholesteremia   . Hypertension     There are no problems to display for this patient.   Past Surgical History:  Procedure Laterality Date  . COLONOSCOPY    . COLONOSCOPY N/A 03/06/2016   Procedure: COLONOSCOPY;  Surgeon: Rogene Houston, MD;  Location[facility];  Service: Endoscopy;  Laterality: N/A;  1200  . CYSTOSCOPY WITH STENT PLACEMENT Right 03/20/2016   Procedure: CYSTOSCOPY WITH STENT PLACEMENT;  Surgeon: Cleon Gustin, MD;  Location: [facility] ORS;  Service: Urology;  Laterality: Right;  . CYSTOSCOPY/RETROGRADE/URETEROSCOPY/STONE EXTRACTION WITH BASKET Right 03/20/2016   Procedure: CYSTOSCOPY/RETROGRADE/STONE EXTRACTION WITH BASKET;  Surgeon: Cleon Gustin, MD;  Location: [facility] ORS;  Service: Urology;  Laterality: Right;  . [facility]  . Left inguinal hernia repair         Home Medications    Prior to Admission medications   Medication Sig Start Date End Date Taking? Authorizing Provider  aspirin EC 81 MG tablet Take 1 tablet (81 mg total) by mouth daily. 03/10/16   Rehman, Mechele Dawley, MD  benazepril (LOTENSIN) 5 MG tablet Take 1 tablet by mouth daily.  01/23/16   [provider]  calcium carbonate (OSCAL) 1500 (600 Ca) MG TABS tablet Take 600 mg of elemental calcium by mouth 2 (two) times daily with a meal.    [provider]  metFORMIN (GLUCOPHAGE) 1000 MG tablet Take 1 tablet by mouth 2 (two) times daily. 01/16/16   [provider]  Multiple Vitamins-Minerals (EYE VITAMINS PO) Take 1 tablet by mouth daily.    [provider]  simvastatin (ZOCOR) 40 MG tablet Take 1 tablet by mouth daily. 01/17/16   [provider]  sulfamethoxazole-trimethoprim (BACTRIM DS,SEPTRA DS) 800-160 MG tablet Take 1 tablet by mouth 2 (two) times daily. 03/20/16   McKenzie, Candee Furbish, MD  traMADol (ULTRAM) 50 MG tablet Take 1 tablet (50 mg total) by mouth every 6 (six) hours as needed. 03/20/16   McKenzie, Candee Furbish, MD    Family History No family history on file.  Social History Social History   Tobacco Use  . Smoking status: Never Smoker  . Smokeless tobacco: Never Used  Substance Use Topics  . Alcohol use: No  . Drug use: No     Allergies   Patient has no known allergies.   Review of Systems Review of Systems   Physical Exam Triage Vital Signs ED Triage Vitals  Enc Vitals Group     BP 03/24/20 0948 99/68     Pulse Rate 03/24/20 0948 97     Resp 03/24/20 0948 19     Temp 03/24/20 0948 97.8  F (36.6 C)     Temp Source 03/24/20 0948 Oral     SpO2 03/24/20 0948 100 %     Weight 03/24/20 0949 138 lb (62.6 kg)     Height 03/24/20 0948 5\' 7"  (1.702 m)     Head Circumference --      Peak Flow --      Pain Score 03/24/20 0948 0     Pain Loc --      Pain Edu? --      Excl. in [place]? --    No data found.  Updated Vital Signs BP 99/68 (BP Location: Right Arm)   Pulse 97   Temp 97.8 F (36.6 C) (Oral)   Resp 19   Ht 5\' 7"  (1.702 m)   Wt 138 lb (62.6 kg)   SpO2 100%   BMI 21.61 kg/m   Visual Acuity Right Eye Distance:   Left Eye Distance:   Bilateral Distance:    Right Eye Near:   Left Eye  Near:    Bilateral Near:     Physical Exam Vitals and nursing note reviewed.  Constitutional:      General: He is not in acute distress.    Appearance: Normal appearance. He is well-developed and normal weight. He is not ill-appearing.  HENT:     Head: Normocephalic and atraumatic.  Eyes:     Conjunctiva/sclera: Conjunctivae normal.  Cardiovascular:     Rate and Rhythm: Tachycardia present. Rhythm irregular.     Heart sounds: No murmur heard.   Pulmonary:     Effort: Pulmonary effort is normal. No respiratory distress.     Breath sounds: Normal breath sounds. No stridor. No wheezing, rhonchi or rales.  Chest:     Chest wall: No tenderness.  Abdominal:     Palpations: Abdomen is soft.     Tenderness: There is no abdominal tenderness.  Musculoskeletal:        General: Normal range of motion.     Cervical back: Normal range of motion and neck supple.  Skin:    General: Skin is warm and dry.     Capillary Refill: Capillary refill takes less than 2 seconds.  Neurological:     General: No focal deficit present.     Mental Status: He is alert and oriented to person, place, and time.  Psychiatric:        Mood and Affect: Mood normal.        Behavior: Behavior normal.        Thought Content: Thought content normal.      UC Treatments / Results  Labs (all labs ordered are listed, but only abnormal results are displayed) Labs Reviewed - No data to display  EKG   Radiology No results found.  Procedures Procedures (including critical care time)  Medications Ordered in UC Medications - No data to display  Initial Impression / Assessment and Plan / UC Course  I have reviewed the triage vital signs and the nursing notes.  Pertinent labs & imaging results that were available during my care of the patient were reviewed by me and considered in my medical decision making (see chart for details).     Weakness Hypotension Tachycardia Fatigue Irregular cardiac  rhythm Palpitations  Presents with weakness, hypotension, tachycardia, palpitations and fatigue since receiving first dose of Moderna Covid vaccine yesterday Does not take beta blocker Denies hx irregular cardiac rhythm ED ECG REPORT   Date: 03/24/2020  EKG Time: 10:38 AM  Rate: 78  Rhythm: atrial flutter,  No previous EKG tracings to compare, RBBB  Intervals:right bundle branch block  ST&T Change: none  Narrative Interpretation: Do not think that this is true A flutter, but still concerning for symptomatic hypotension and palpitations given medication regimen and medical history  Discussed with patient and caregiver that he would be better served in the ER for further evaluation and treatment Low suspicion for vaccine side effects High suspicion for cardiac etiology Caregiver and patient agreeable to treatment plan Declines EMS, I think that it would be fine to transport in personal vehicle Caregiver will drive patient to ER Stable at discharge     Final Clinical Impressions(s) / UC Diagnoses   Final diagnoses:  Weakness  Hypotension, unspecified hypotension type  Other fatigue  Irregular cardiac rhythm  Palpitations     Discharge Instructions     Go to the ER for further evaluation and treatment    ED Prescriptions    None     PDMP not reviewed this encounter.   Faustino Congress, NP 03/24/20 1038

## 2020-03-24 NOTE — Discharge Instructions (Signed)
Your work-up today was overall reassuring.  There are no clear explanations as to why your blood pressure was little bit low today.  We suspect that this may be due to your blood pressure medications.  Please make sure to hold your Lotensin and follow-up with your primary care doctor as soon as possible.  Return to the ER if your symptoms worsen.

## 2020-03-24 NOTE — Discharge Instructions (Signed)
Go to the ER for further evaluation and treatment. 

## 2020-03-29 DIAGNOSIS — Z6822 Body mass index (BMI) 22.0-22.9, adult: Secondary | ICD-10-CM | POA: Diagnosis not present

## 2020-03-29 DIAGNOSIS — I959 Hypotension, unspecified: Secondary | ICD-10-CM | POA: Diagnosis not present

## 2020-05-03 DIAGNOSIS — E7849 Other hyperlipidemia: Secondary | ICD-10-CM | POA: Diagnosis not present

## 2020-05-03 DIAGNOSIS — I959 Hypotension, unspecified: Secondary | ICD-10-CM | POA: Diagnosis not present

## 2020-05-03 DIAGNOSIS — Z6822 Body mass index (BMI) 22.0-22.9, adult: Secondary | ICD-10-CM | POA: Diagnosis not present

## 2020-05-03 DIAGNOSIS — M818 Other osteoporosis without current pathological fracture: Secondary | ICD-10-CM | POA: Diagnosis not present

## 2020-05-03 DIAGNOSIS — E119 Type 2 diabetes mellitus without complications: Secondary | ICD-10-CM | POA: Diagnosis not present

## 2020-08-03 DIAGNOSIS — E7849 Other hyperlipidemia: Secondary | ICD-10-CM | POA: Diagnosis not present

## 2020-08-03 DIAGNOSIS — I959 Hypotension, unspecified: Secondary | ICD-10-CM | POA: Diagnosis not present

## 2020-08-03 DIAGNOSIS — Z6823 Body mass index (BMI) 23.0-23.9, adult: Secondary | ICD-10-CM | POA: Diagnosis not present

## 2020-08-03 DIAGNOSIS — M818 Other osteoporosis without current pathological fracture: Secondary | ICD-10-CM | POA: Diagnosis not present

## 2020-08-03 DIAGNOSIS — E119 Type 2 diabetes mellitus without complications: Secondary | ICD-10-CM | POA: Diagnosis not present

## 2020-09-29 ENCOUNTER — Emergency Department (HOSPITAL_COMMUNITY): Payer: Medicare Other

## 2020-09-29 ENCOUNTER — Other Ambulatory Visit: Payer: Self-pay

## 2020-09-29 ENCOUNTER — Encounter (HOSPITAL_COMMUNITY): Payer: Self-pay | Admitting: *Deleted

## 2020-09-29 ENCOUNTER — Emergency Department (HOSPITAL_COMMUNITY)
Admission: EM | Admit: 2020-09-29 | Discharge: 2020-09-29 | Disposition: A | Discharge status: Home / Self Care | Payer: Medicare Other | Attending: Emergency Medicine | Admitting: Emergency Medicine

## 2020-09-29 DIAGNOSIS — Z7982 Long term (current) use of aspirin: Secondary | ICD-10-CM | POA: Insufficient documentation

## 2020-09-29 DIAGNOSIS — E119 Type 2 diabetes mellitus without complications: Secondary | ICD-10-CM | POA: Diagnosis not present

## 2020-09-29 DIAGNOSIS — Z79899 Other long term (current) drug therapy: Secondary | ICD-10-CM | POA: Diagnosis not present

## 2020-09-29 DIAGNOSIS — I872 Venous insufficiency (chronic) (peripheral): Secondary | ICD-10-CM

## 2020-09-29 DIAGNOSIS — L089 Local infection of the skin and subcutaneous tissue, unspecified: Secondary | ICD-10-CM | POA: Diagnosis not present

## 2020-09-29 DIAGNOSIS — M79661 Pain in right lower leg: Secondary | ICD-10-CM | POA: Diagnosis present

## 2020-09-29 DIAGNOSIS — Z7984 Long term (current) use of oral hypoglycemic drugs: Secondary | ICD-10-CM | POA: Diagnosis not present

## 2020-09-29 DIAGNOSIS — I10 Essential (primary) hypertension: Secondary | ICD-10-CM | POA: Diagnosis not present

## 2020-09-29 DIAGNOSIS — Z0389 Encounter for observation for other suspected diseases and conditions ruled out: Secondary | ICD-10-CM | POA: Diagnosis not present

## 2020-09-29 LAB — COMPREHENSIVE METABOLIC PANEL
ALT: 11 U/L (ref 0–44)
AST: 19 U/L (ref 15–41)
Albumin: 4 g/dL (ref 3.5–5.0)
Alkaline Phosphatase: 59 U/L (ref 38–126)
Anion gap: 8 (ref 5–15)
BUN: 21 mg/dL (ref 8–23)
CO2: 27 mmol/L (ref 22–32)
Calcium: 9.3 mg/dL (ref 8.9–10.3)
Chloride: 104 mmol/L (ref 98–111)
Creatinine, Ser: 0.99 mg/dL (ref 0.61–1.24)
GFR, Estimated: >60 mL/min (ref >60)
Glucose, Bld: 154 mg/dL — ABNORMAL HIGH (ref 70–99)
Potassium: 4.5 mmol/L (ref 3.5–5.1)
Sodium: 139 mmol/L (ref 135–145)
Total Bilirubin: 0.7 mg/dL (ref 0.3–1.2)
Total Protein: 6.9 g/dL (ref 6.5–8.1)

## 2020-09-29 LAB — CBC WITH DIFFERENTIAL/PLATELET
Abs Immature Granulocytes: 0.02 10*3/uL (ref 0.00–0.07)
Basophils Absolute: 0 10*3/uL (ref 0.0–0.1)
Basophils Relative: 0 %
Eosinophils Absolute: 0 10*3/uL (ref 0.0–0.5)
Eosinophils Relative: 1 %
HCT: 37.4 % — ABNORMAL LOW (ref 39.0–52.0)
Hemoglobin: 11.8 g/dL — ABNORMAL LOW (ref 13.0–17.0)
Immature Granulocytes: 0 %
Lymphocytes Relative: 29 %
Lymphs Abs: 1.4 10*3/uL (ref 0.7–4.0)
MCH: 36.8 pg — ABNORMAL HIGH (ref 26.0–34.0)
MCHC: 31.6 g/dL (ref 30.0–36.0)
MCV: 116.5 fL — ABNORMAL HIGH (ref 80.0–100.0)
Monocytes Absolute: 0.4 10*3/uL (ref 0.1–1.0)
Monocytes Relative: 9 %
Neutro Abs: 2.8 10*3/uL (ref 1.7–7.7)
Neutrophils Relative %: 61 %
Platelets: 81 10*3/uL — ABNORMAL LOW (ref 150–400)
RBC: 3.21 MIL/uL — ABNORMAL LOW (ref 4.22–5.81)
RDW: 14.4 % (ref 11.5–15.5)
WBC: 4.6 10*3/uL (ref 4.0–10.5)
nRBC: 0 % (ref 0.0–0.2)

## 2020-09-29 MED ORDER — CEPHALEXIN 500 MG PO CAPS: 500.0000 mg | ORAL_CAPSULE | Freq: Four times a day (QID) | ORAL | 0 refills | Status: AC

## 2020-09-29 NOTE — ED Notes (Signed)
Entered room and introduced self to patient. Pt appears to be resting in bed, respirations are even and unlabored with equal chest rise and fall. Bed is locked in the lowest position, side rails x2, call bell within reach. Pt educated on call light use and hourly rounding, verbalized understanding and in agreement at this time. All questions and concerns voiced addressed. Refreshments offered and provided per patient request.  

## 2020-09-29 NOTE — ED Notes (Signed)
Patient transported to Ultrasound 

## 2020-09-29 NOTE — ED Notes (Signed)
Lab at bedside

## 2020-09-29 NOTE — Discharge Instructions (Addendum)
See your Physician for recheck.  Return if symptoms worsen or change

## 2020-09-29 NOTE — ED Triage Notes (Signed)
Bilateral leg pain with red areas on both legs for 2 days

## 2020-09-30 NOTE — ED Provider Notes (Signed)
[facility] EMERGENCY DEPARTMENT Provider Note   CSN: [id number] Arrival date & time: 09/29/20  1304     History Chief Complaint  Patient presents with  . Leg Pain    Kyle IDDINGS is a 87 y.o. male.  The history is provided by the patient. No language interpreter was used.  Leg Pain Location:  Leg Injury: no   Leg location:  L upper leg and R upper leg Pain details:    Quality:  Aching   Severity:  Moderate   Onset quality:  Gradual   Timing:  Constant   Progression:  Worsening Chronicity:  New Foreign body present:  No foreign bodies Prior injury to area:  No Relieved by:  Nothing Worsened by:  Nothing Ineffective treatments:  None tried Pt worried about blood clots in his legs.  Pt reports redness on the back of his legs.     Past Medical History:  Diagnosis Date  . Colon polyps   . Diabetes mellitus without complication ([place])   . Hypercholesteremia   . Hypertension     There are no problems to display for this patient.   Past Surgical History:  Procedure Laterality Date  . COLONOSCOPY    . COLONOSCOPY N/A 03/06/2016   Procedure: COLONOSCOPY;  Surgeon: Rogene Houston, MD;  Location: [facility] ENDO SUITE;  Service: Endoscopy;  Laterality: N/A;  1200  . CYSTOSCOPY WITH STENT PLACEMENT Right 03/20/2016   Procedure: CYSTOSCOPY WITH STENT PLACEMENT;  Surgeon: Cleon Gustin, MD;  Location: [facility] ORS;  Service: Urology;  Laterality: Right;  . CYSTOSCOPY/RETROGRADE/URETEROSCOPY/STONE EXTRACTION WITH BASKET Right 03/20/2016   Procedure: CYSTOSCOPY/RETROGRADE/STONE EXTRACTION WITH BASKET;  Surgeon: Cleon Gustin, MD;  Location: [facility] ORS;  Service: Urology;  Laterality: Right;  . [facility]  . Left inguinal hernia repair         History reviewed. No pertinent family history.  Social History   Tobacco Use  . Smoking status: Never Smoker  . Smokeless tobacco: Never Used  Substance Use Topics  . Alcohol use: No  . Drug use: No     Home Medications Prior to Admission medications   Medication Sig Start Date End Date Taking? Authorizing Provider  cephALEXin (KEFLEX) 500 MG capsule Take 1 capsule (500 mg total) by mouth 4 (four) times daily for 10 days. 09/29/20 10/09/20 Yes Fransico Meadow, PA-C  aspirin EC 81 MG tablet Take 1 tablet (81 mg total) by mouth daily. 03/10/16   Rehman, Mechele Dawley, MD  benazepril (LOTENSIN) 5 MG tablet Take 1 tablet by mouth daily. 01/23/16   [provider]  calcium carbonate (OSCAL) 1500 (600 Ca) MG TABS tablet Take 600 mg of elemental calcium by mouth 2 (two) times daily with a meal.    [provider]  metFORMIN (GLUCOPHAGE) 1000 MG tablet Take 1 tablet by mouth 2 (two) times daily. 01/16/16   [provider]  Multiple Vitamins-Minerals (EYE VITAMINS PO) Take 1 tablet by mouth daily.    [provider]  simvastatin (ZOCOR) 40 MG tablet Take 1 tablet by mouth daily. 01/17/16   [provider]  sulfamethoxazole-trimethoprim (BACTRIM DS,SEPTRA DS) 800-160 MG tablet Take 1 tablet by mouth 2 (two) times daily. 03/20/16   McKenzie, Candee Furbish, MD  traMADol (ULTRAM) 50 MG tablet Take 1 tablet (50 mg total) by mouth every 6 (six) hours as needed. 03/20/16   McKenzie, Candee Furbish, MD    Allergies    Patient has  no known allergies.  Review of Systems   Review of Systems  All other systems reviewed and are negative.   Physical Exam Updated Vital Signs BP (!) 143/64   Pulse 89   Temp (!) 102.9 F (39.4 C) (Oral)   Resp 19   SpO2 93%   Physical Exam Vitals and nursing note reviewed.  Constitutional:      Appearance: He is well-developed and well-nourished.  HENT:     Head: Normocephalic and atraumatic.  Eyes:     Conjunctiva/sclera: Conjunctivae normal.  Cardiovascular:     Rate and Rhythm: Normal rate and regular rhythm.     Heart sounds: No murmur heard.   Pulmonary:     Effort: Pulmonary effort is normal. No respiratory distress.     Breath  sounds: Normal breath sounds.  Abdominal:     Palpations: Abdomen is soft.     Tenderness: There is no abdominal tenderness.  Musculoskeletal:        General: No swelling, tenderness or edema. Normal range of motion.     Cervical back: Neck supple.     Comments: Red rash back of bilat legs looks like a stasis dermatitis   Skin:    General: Skin is warm and dry.  Neurological:     General: No focal deficit present.     Mental Status: He is alert.  Psychiatric:        Mood and Affect: Mood and affect normal.     ED Results / Procedures / Treatments   Labs (all labs ordered are listed, but only abnormal results are displayed) Labs Reviewed  CBC WITH DIFFERENTIAL/PLATELET - Abnormal; Notable for the following components:      Result Value   RBC 3.21 (*)    Hemoglobin 11.8 (*)    HCT 37.4 (*)    MCV 116.5 (*)    MCH 36.8 (*)    Platelets 81 (*)    All other components within normal limits  COMPREHENSIVE METABOLIC PANEL - Abnormal; Notable for the following components:   Glucose, Bld 154 (*)    All other components within normal limits    EKG None  Radiology US Venous Img Lower Bilateral  Result Date: 09/29/2020 EXAM: BILATERAL LOWER EXTREMITY VENOUS DOPPLER ULTRASOUND TECHNIQUE: Gray-scale sonography with graded compression, as well as color Doppler and duplex ultrasound were performed to evaluate the lower extremity deep venous systems from the level of the common femoral vein and including the common femoral, femoral, profunda femoral, popliteal and calf veins including the posterior tibial, peroneal and gastrocnemius veins when visible. The superficial great saphenous vein was also interrogated. Spectral Doppler was utilized to evaluate flow at rest and with distal augmentation maneuvers in the common femoral, femoral and popliteal veins. COMPARISON:  None. FINDINGS: RIGHT LOWER EXTREMITY Common Femoral Vein: No evidence of thrombus. Normal compressibility, respiratory  phasicity and response to augmentation. Saphenofemoral Junction: No evidence of thrombus. Normal compressibility and flow on color Doppler imaging. Profunda Femoral Vein: No evidence of thrombus. Normal compressibility and flow on color Doppler imaging. Femoral Vein: No evidence of thrombus. Normal compressibility, respiratory phasicity and response to augmentation. Popliteal Vein: No evidence of thrombus. Normal compressibility, respiratory phasicity and response to augmentation. Calf Veins: The peroneal veins are not visualized. No visible thrombus. Normal compressibility and flow on color Doppler imaging. Superficial Great Saphenous Vein: No evidence of thrombus. Normal compressibility. Venous Reflux:  None. LEFT LOWER EXTREMITY Common Femoral Vein: No evidence of thrombus. Normal compressibility, respiratory phasicity and  response to augmentation. Saphenofemoral Junction: No evidence of thrombus. Normal compressibility and flow on color Doppler imaging. Profunda Femoral Vein: No evidence of thrombus. Normal compressibility and flow on color Doppler imaging. Femoral Vein: No evidence of thrombus. Normal compressibility, respiratory phasicity and response to augmentation. Popliteal Vein: No evidence of thrombus. Normal compressibility, respiratory phasicity and response to augmentation. Calf Veins: The peroneal veins are not visualized. No visible thrombus. Normal compressibility and flow on color Doppler imaging. Superficial Great Saphenous Vein: No evidence of thrombus. Normal compressibility. Venous Reflux:  None. IMPRESSION: No evidence of deep venous thrombosis in either lower extremity. Electronically Signed   By: Margaretha Sheffield MD   On: 09/29/2020 17:39    Procedures Procedures   Medications Ordered in ED Medications - No data to display  ED Course  I have reviewed the triage vital signs and the nursing notes.  Pertinent labs & imaging results that were available during my care of the patient  were reviewed by me and considered in my medical decision making (see chart for details).    MDM Rules/Calculators/A&P                          MDM:  Dr. Laverta Baltimore in to see and examine.  Ultrasound bilat legs no dvt.  Labs reviewed  Platelets decreased.  Pt given rx for keflex.  Pt advised to see his MD for recheck next week  Final Clinical Impression(s) / ED Diagnoses Final diagnoses:  Venous stasis dermatitis of both lower extremities  Skin infection    Rx / DC Orders ED Discharge Orders         Ordered    cephALEXin (KEFLEX) 500 MG capsule  4 times daily        09/29/20 1801        An After Visit Summary was printed and given to the patient.    Fransico Meadow, [place] 09/30/20 1502    Margette Fast, MD 10/02/20 1024

## 2020-10-02 DIAGNOSIS — L600 Ingrowing nail: Secondary | ICD-10-CM | POA: Diagnosis not present

## 2020-10-02 DIAGNOSIS — Z6823 Body mass index (BMI) 23.0-23.9, adult: Secondary | ICD-10-CM | POA: Diagnosis not present

## 2020-10-02 DIAGNOSIS — I878 Other specified disorders of veins: Secondary | ICD-10-CM | POA: Diagnosis not present

## 2020-10-05 DIAGNOSIS — L0390 Cellulitis, unspecified: Secondary | ICD-10-CM | POA: Diagnosis not present

## 2020-11-01 DIAGNOSIS — Z6822 Body mass index (BMI) 22.0-22.9, adult: Secondary | ICD-10-CM | POA: Diagnosis not present

## 2020-11-01 DIAGNOSIS — I872 Venous insufficiency (chronic) (peripheral): Secondary | ICD-10-CM | POA: Diagnosis not present

## 2020-11-01 DIAGNOSIS — I878 Other specified disorders of veins: Secondary | ICD-10-CM | POA: Diagnosis not present

## 2020-11-01 DIAGNOSIS — M818 Other osteoporosis without current pathological fracture: Secondary | ICD-10-CM | POA: Diagnosis not present

## 2020-11-01 DIAGNOSIS — E1169 Type 2 diabetes mellitus with other specified complication: Secondary | ICD-10-CM | POA: Diagnosis not present

## 2020-12-02 DIAGNOSIS — E1169 Type 2 diabetes mellitus with other specified complication: Secondary | ICD-10-CM | POA: Diagnosis not present

## 2020-12-02 DIAGNOSIS — M818 Other osteoporosis without current pathological fracture: Secondary | ICD-10-CM | POA: Diagnosis not present

## 2020-12-08 DIAGNOSIS — Z743 Need for continuous supervision: Secondary | ICD-10-CM | POA: Diagnosis not present

## 2020-12-08 DIAGNOSIS — R531 Weakness: Secondary | ICD-10-CM | POA: Diagnosis not present

## 2020-12-08 DIAGNOSIS — R5381 Other malaise: Secondary | ICD-10-CM | POA: Diagnosis not present

## 2021-02-01 DIAGNOSIS — M818 Other osteoporosis without current pathological fracture: Secondary | ICD-10-CM | POA: Diagnosis not present

## 2021-02-01 DIAGNOSIS — I872 Venous insufficiency (chronic) (peripheral): Secondary | ICD-10-CM | POA: Diagnosis not present

## 2021-02-01 DIAGNOSIS — E1143 Type 2 diabetes mellitus with diabetic autonomic (poly)neuropathy: Secondary | ICD-10-CM | POA: Diagnosis not present

## 2021-02-08 DIAGNOSIS — H353112 Nonexudative age-related macular degeneration, right eye, intermediate dry stage: Secondary | ICD-10-CM | POA: Diagnosis not present

## 2021-02-12 DIAGNOSIS — E1143 Type 2 diabetes mellitus with diabetic autonomic (poly)neuropathy: Secondary | ICD-10-CM | POA: Diagnosis not present

## 2021-02-12 DIAGNOSIS — M818 Other osteoporosis without current pathological fracture: Secondary | ICD-10-CM | POA: Diagnosis not present

## 2021-02-12 DIAGNOSIS — Z6822 Body mass index (BMI) 22.0-22.9, adult: Secondary | ICD-10-CM | POA: Diagnosis not present

## 2021-02-12 DIAGNOSIS — Z0000 Encounter for general adult medical examination without abnormal findings: Secondary | ICD-10-CM | POA: Diagnosis not present

## 2021-02-12 DIAGNOSIS — E1169 Type 2 diabetes mellitus with other specified complication: Secondary | ICD-10-CM | POA: Diagnosis not present

## 2021-02-12 DIAGNOSIS — I872 Venous insufficiency (chronic) (peripheral): Secondary | ICD-10-CM | POA: Diagnosis not present

## 2021-02-12 DIAGNOSIS — I878 Other specified disorders of veins: Secondary | ICD-10-CM | POA: Diagnosis not present

## 2021-02-12 DIAGNOSIS — E782 Mixed hyperlipidemia: Secondary | ICD-10-CM | POA: Diagnosis not present

## 2021-03-04 DIAGNOSIS — E1143 Type 2 diabetes mellitus with diabetic autonomic (poly)neuropathy: Secondary | ICD-10-CM | POA: Diagnosis not present

## 2021-03-20 DIAGNOSIS — M810 Age-related osteoporosis without current pathological fracture: Secondary | ICD-10-CM | POA: Diagnosis not present

## 2021-04-04 DIAGNOSIS — M818 Other osteoporosis without current pathological fracture: Secondary | ICD-10-CM | POA: Diagnosis not present

## 2021-04-04 DIAGNOSIS — E1143 Type 2 diabetes mellitus with diabetic autonomic (poly)neuropathy: Secondary | ICD-10-CM | POA: Diagnosis not present

## 2021-05-14 DIAGNOSIS — Z0000 Encounter for general adult medical examination without abnormal findings: Secondary | ICD-10-CM | POA: Diagnosis not present

## 2021-05-14 DIAGNOSIS — E1143 Type 2 diabetes mellitus with diabetic autonomic (poly)neuropathy: Secondary | ICD-10-CM | POA: Diagnosis not present

## 2021-05-14 DIAGNOSIS — I700 Atherosclerosis of aorta: Secondary | ICD-10-CM | POA: Diagnosis not present

## 2021-05-14 DIAGNOSIS — I872 Venous insufficiency (chronic) (peripheral): Secondary | ICD-10-CM | POA: Diagnosis not present

## 2021-05-14 DIAGNOSIS — M818 Other osteoporosis without current pathological fracture: Secondary | ICD-10-CM | POA: Diagnosis not present

## 2021-05-14 DIAGNOSIS — Z6823 Body mass index (BMI) 23.0-23.9, adult: Secondary | ICD-10-CM | POA: Diagnosis not present

## 2021-05-14 DIAGNOSIS — I878 Other specified disorders of veins: Secondary | ICD-10-CM | POA: Diagnosis not present

## 2021-08-04 IMAGING — DX DG CHEST 2V
2 series · 2 of 2 positions shown · non-contrast
Comparison: Prior chest radiograph 02/29/2020 and early

CLINICAL DATA: Chest tightness. Additional history provided:
triage.

EXAM:
CHEST - 2 VIEW

[chest pa]
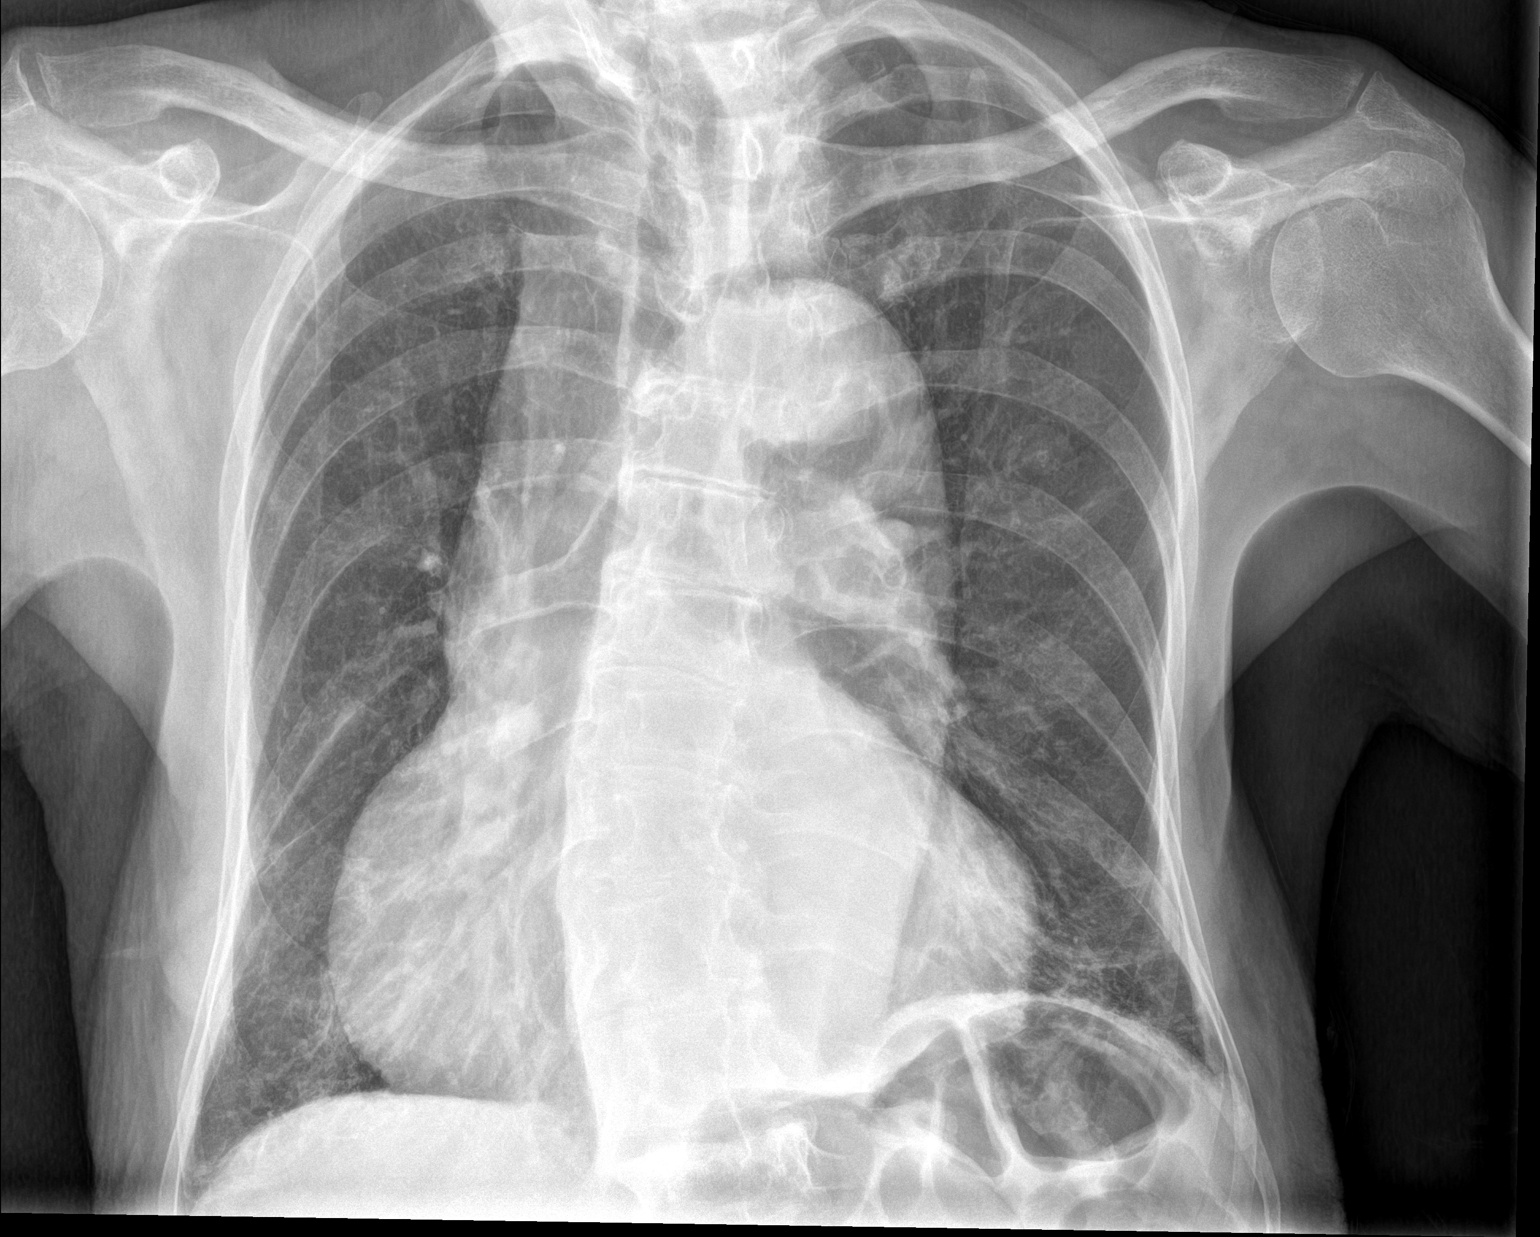

[chest lat]
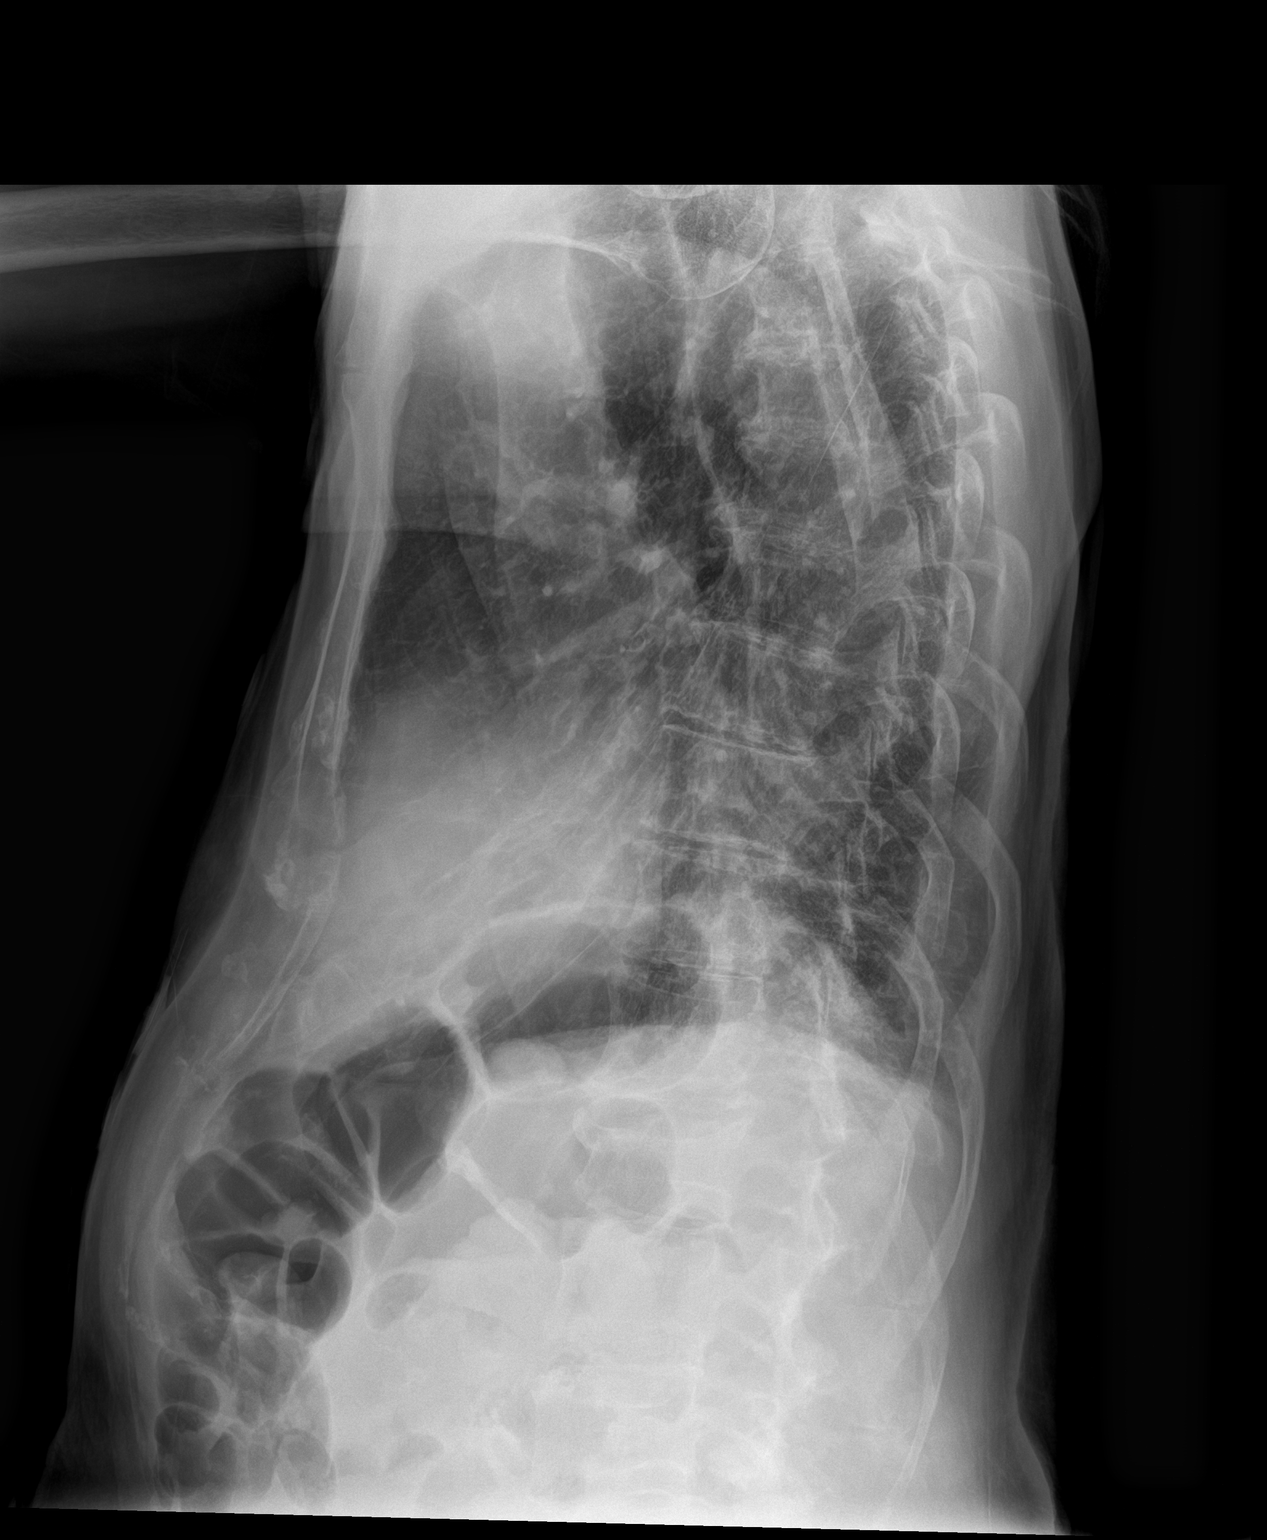

[2 of 2 positions shown; findings below may reference images not displayed]

FINDINGS: The patient is rotated to the right. Unchanged severe cardiomegaly.
Aortic atherosclerosis no appreciable airspace consolidation or
pulmonary edema. Linear scarring or atelectasis within the left lung
base. No evidence of pleural effusion or pneumothorax. No acute bony
abnormality identified. Thoracic dextrocurvature.
IMPRESSION: Unchanged severe cardiomegaly.

Linear scarring or atelectasis within the left lung base.

Aortic Atherosclerosis (C42UT-2CL.L).

## 2021-08-14 DIAGNOSIS — Z6823 Body mass index (BMI) 23.0-23.9, adult: Secondary | ICD-10-CM | POA: Diagnosis not present

## 2021-08-14 DIAGNOSIS — I10 Essential (primary) hypertension: Secondary | ICD-10-CM | POA: Diagnosis not present

## 2021-08-14 DIAGNOSIS — I700 Atherosclerosis of aorta: Secondary | ICD-10-CM | POA: Diagnosis not present

## 2021-08-14 DIAGNOSIS — I878 Other specified disorders of veins: Secondary | ICD-10-CM | POA: Diagnosis not present

## 2021-08-14 DIAGNOSIS — Z0000 Encounter for general adult medical examination without abnormal findings: Secondary | ICD-10-CM | POA: Diagnosis not present

## 2021-08-14 DIAGNOSIS — M818 Other osteoporosis without current pathological fracture: Secondary | ICD-10-CM | POA: Diagnosis not present

## 2021-08-14 DIAGNOSIS — I872 Venous insufficiency (chronic) (peripheral): Secondary | ICD-10-CM | POA: Diagnosis not present

## 2021-08-14 DIAGNOSIS — E1143 Type 2 diabetes mellitus with diabetic autonomic (poly)neuropathy: Secondary | ICD-10-CM | POA: Diagnosis not present

## 2021-08-16 DIAGNOSIS — H353132 Nonexudative age-related macular degeneration, bilateral, intermediate dry stage: Secondary | ICD-10-CM | POA: Diagnosis not present

## 2021-10-08 DIAGNOSIS — R0789 Other chest pain: Secondary | ICD-10-CM | POA: Diagnosis not present

## 2021-10-08 DIAGNOSIS — Z794 Long term (current) use of insulin: Secondary | ICD-10-CM | POA: Diagnosis not present

## 2021-10-08 DIAGNOSIS — N136 Pyonephrosis: Secondary | ICD-10-CM | POA: Diagnosis not present

## 2021-10-08 DIAGNOSIS — Z79899 Other long term (current) drug therapy: Secondary | ICD-10-CM | POA: Diagnosis not present

## 2021-10-08 DIAGNOSIS — S301XXA Contusion of abdominal wall, initial encounter: Secondary | ICD-10-CM | POA: Diagnosis not present

## 2021-10-08 DIAGNOSIS — N179 Acute kidney failure, unspecified: Secondary | ICD-10-CM | POA: Diagnosis not present

## 2021-10-08 DIAGNOSIS — E7800 Pure hypercholesterolemia, unspecified: Secondary | ICD-10-CM | POA: Diagnosis not present

## 2021-10-08 DIAGNOSIS — S299XXA Unspecified injury of thorax, initial encounter: Secondary | ICD-10-CM | POA: Diagnosis not present

## 2021-10-08 DIAGNOSIS — D696 Thrombocytopenia, unspecified: Secondary | ICD-10-CM | POA: Diagnosis not present

## 2021-10-08 DIAGNOSIS — R079 Chest pain, unspecified: Secondary | ICD-10-CM | POA: Diagnosis not present

## 2021-10-08 DIAGNOSIS — I10 Essential (primary) hypertension: Secondary | ICD-10-CM | POA: Diagnosis not present

## 2021-10-08 DIAGNOSIS — R109 Unspecified abdominal pain: Secondary | ICD-10-CM | POA: Diagnosis not present

## 2021-10-08 DIAGNOSIS — E119 Type 2 diabetes mellitus without complications: Secondary | ICD-10-CM | POA: Diagnosis not present

## 2021-10-08 DIAGNOSIS — M542 Cervicalgia: Secondary | ICD-10-CM | POA: Diagnosis not present

## 2021-10-08 DIAGNOSIS — N261 Atrophy of kidney (terminal): Secondary | ICD-10-CM | POA: Diagnosis not present

## 2021-10-08 DIAGNOSIS — S2020XA Contusion of thorax, unspecified, initial encounter: Secondary | ICD-10-CM | POA: Diagnosis not present

## 2021-10-08 DIAGNOSIS — M47816 Spondylosis without myelopathy or radiculopathy, lumbar region: Secondary | ICD-10-CM | POA: Diagnosis not present

## 2021-10-08 DIAGNOSIS — I517 Cardiomegaly: Secondary | ICD-10-CM | POA: Diagnosis not present

## 2021-10-08 DIAGNOSIS — I7121 Aneurysm of the ascending aorta, without rupture: Secondary | ICD-10-CM | POA: Diagnosis not present

## 2021-10-08 DIAGNOSIS — E785 Hyperlipidemia, unspecified: Secondary | ICD-10-CM | POA: Diagnosis not present

## 2021-10-08 DIAGNOSIS — G319 Degenerative disease of nervous system, unspecified: Secondary | ICD-10-CM | POA: Diagnosis not present

## 2021-10-08 DIAGNOSIS — R569 Unspecified convulsions: Secondary | ICD-10-CM | POA: Diagnosis not present

## 2021-10-08 DIAGNOSIS — I4510 Unspecified right bundle-branch block: Secondary | ICD-10-CM | POA: Diagnosis not present

## 2021-10-08 DIAGNOSIS — M5030 Other cervical disc degeneration, unspecified cervical region: Secondary | ICD-10-CM | POA: Diagnosis not present

## 2021-10-08 DIAGNOSIS — Z7982 Long term (current) use of aspirin: Secondary | ICD-10-CM | POA: Diagnosis not present

## 2021-10-08 DIAGNOSIS — Z8639 Personal history of other endocrine, nutritional and metabolic disease: Secondary | ICD-10-CM | POA: Diagnosis not present

## 2021-10-08 DIAGNOSIS — Z7983 Long term (current) use of bisphosphonates: Secondary | ICD-10-CM | POA: Diagnosis not present

## 2021-10-08 DIAGNOSIS — I440 Atrioventricular block, first degree: Secondary | ICD-10-CM | POA: Diagnosis not present

## 2021-10-08 DIAGNOSIS — S0990XA Unspecified injury of head, initial encounter: Secondary | ICD-10-CM | POA: Diagnosis not present

## 2021-10-08 DIAGNOSIS — K4090 Unilateral inguinal hernia, without obstruction or gangrene, not specified as recurrent: Secondary | ICD-10-CM | POA: Diagnosis not present

## 2021-10-08 DIAGNOSIS — Z20822 Contact with and (suspected) exposure to covid-19: Secondary | ICD-10-CM | POA: Diagnosis not present

## 2021-10-08 DIAGNOSIS — Z7902 Long term (current) use of antithrombotics/antiplatelets: Secondary | ICD-10-CM | POA: Diagnosis not present

## 2021-10-08 DIAGNOSIS — D649 Anemia, unspecified: Secondary | ICD-10-CM | POA: Diagnosis not present

## 2021-10-09 DIAGNOSIS — E119 Type 2 diabetes mellitus without complications: Secondary | ICD-10-CM | POA: Diagnosis not present

## 2021-10-09 DIAGNOSIS — K4090 Unilateral inguinal hernia, without obstruction or gangrene, not specified as recurrent: Secondary | ICD-10-CM | POA: Diagnosis not present

## 2021-10-09 DIAGNOSIS — E785 Hyperlipidemia, unspecified: Secondary | ICD-10-CM | POA: Diagnosis not present

## 2021-10-09 DIAGNOSIS — R569 Unspecified convulsions: Secondary | ICD-10-CM | POA: Diagnosis not present

## 2021-10-12 DIAGNOSIS — R079 Chest pain, unspecified: Secondary | ICD-10-CM | POA: Diagnosis not present

## 2021-10-12 DIAGNOSIS — E785 Hyperlipidemia, unspecified: Secondary | ICD-10-CM | POA: Diagnosis not present

## 2021-10-12 DIAGNOSIS — Z7984 Long term (current) use of oral hypoglycemic drugs: Secondary | ICD-10-CM | POA: Diagnosis not present

## 2021-10-12 DIAGNOSIS — J90 Pleural effusion, not elsewhere classified: Secondary | ICD-10-CM | POA: Diagnosis not present

## 2021-10-12 DIAGNOSIS — Z79899 Other long term (current) drug therapy: Secondary | ICD-10-CM | POA: Diagnosis not present

## 2021-10-12 DIAGNOSIS — R0789 Other chest pain: Secondary | ICD-10-CM | POA: Diagnosis not present

## 2021-10-12 DIAGNOSIS — Z7982 Long term (current) use of aspirin: Secondary | ICD-10-CM | POA: Diagnosis not present

## 2021-10-12 DIAGNOSIS — Z743 Need for continuous supervision: Secondary | ICD-10-CM | POA: Diagnosis not present

## 2021-10-12 DIAGNOSIS — Z794 Long term (current) use of insulin: Secondary | ICD-10-CM | POA: Diagnosis not present

## 2021-10-12 DIAGNOSIS — E119 Type 2 diabetes mellitus without complications: Secondary | ICD-10-CM | POA: Diagnosis not present

## 2021-10-12 DIAGNOSIS — J9811 Atelectasis: Secondary | ICD-10-CM | POA: Diagnosis not present

## 2021-10-12 DIAGNOSIS — R6889 Other general symptoms and signs: Secondary | ICD-10-CM | POA: Diagnosis not present

## 2021-10-12 DIAGNOSIS — I4891 Unspecified atrial fibrillation: Secondary | ICD-10-CM | POA: Diagnosis not present

## 2021-10-12 DIAGNOSIS — R9431 Abnormal electrocardiogram [ECG] [EKG]: Secondary | ICD-10-CM | POA: Diagnosis not present

## 2021-10-12 DIAGNOSIS — S2020XA Contusion of thorax, unspecified, initial encounter: Secondary | ICD-10-CM | POA: Diagnosis not present

## 2021-10-12 DIAGNOSIS — I517 Cardiomegaly: Secondary | ICD-10-CM | POA: Diagnosis not present

## 2021-10-16 DIAGNOSIS — Z6822 Body mass index (BMI) 22.0-22.9, adult: Secondary | ICD-10-CM | POA: Diagnosis not present

## 2021-10-16 DIAGNOSIS — S20211A Contusion of right front wall of thorax, initial encounter: Secondary | ICD-10-CM | POA: Diagnosis not present

## 2021-11-13 DIAGNOSIS — I10 Essential (primary) hypertension: Secondary | ICD-10-CM | POA: Diagnosis not present

## 2021-11-13 DIAGNOSIS — I959 Hypotension, unspecified: Secondary | ICD-10-CM | POA: Diagnosis not present

## 2021-11-13 DIAGNOSIS — Z6821 Body mass index (BMI) 21.0-21.9, adult: Secondary | ICD-10-CM | POA: Diagnosis not present

## 2021-11-13 DIAGNOSIS — E1143 Type 2 diabetes mellitus with diabetic autonomic (poly)neuropathy: Secondary | ICD-10-CM | POA: Diagnosis not present

## 2021-11-13 DIAGNOSIS — Z0000 Encounter for general adult medical examination without abnormal findings: Secondary | ICD-10-CM | POA: Diagnosis not present

## 2021-12-03 DIAGNOSIS — G629 Polyneuropathy, unspecified: Secondary | ICD-10-CM | POA: Diagnosis not present

## 2021-12-03 DIAGNOSIS — Z6821 Body mass index (BMI) 21.0-21.9, adult: Secondary | ICD-10-CM | POA: Diagnosis not present

## 2022-02-07 DIAGNOSIS — R58 Hemorrhage, not elsewhere classified: Secondary | ICD-10-CM | POA: Diagnosis not present

## 2022-02-09 IMAGING — US US EXTREM LOW VENOUS
1 series · 13 of 24 positions shown · non-contrast
Comparison: None.



[Series 1: us venous img lower bilat (dvt) · portal-venous · 13 of 67 slices shown]
[im 1/67]
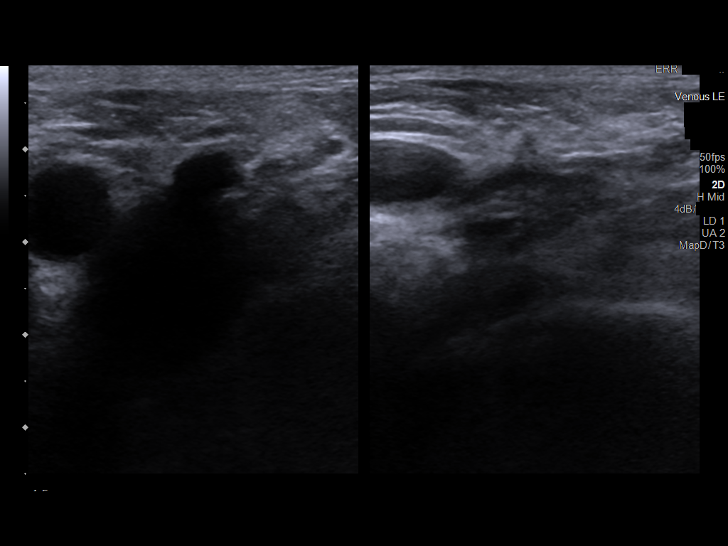
[im 6/67]
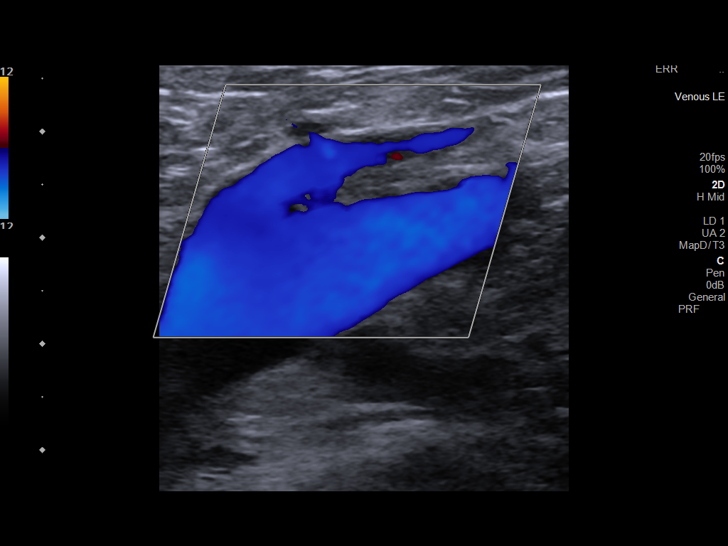
[im 12/67]
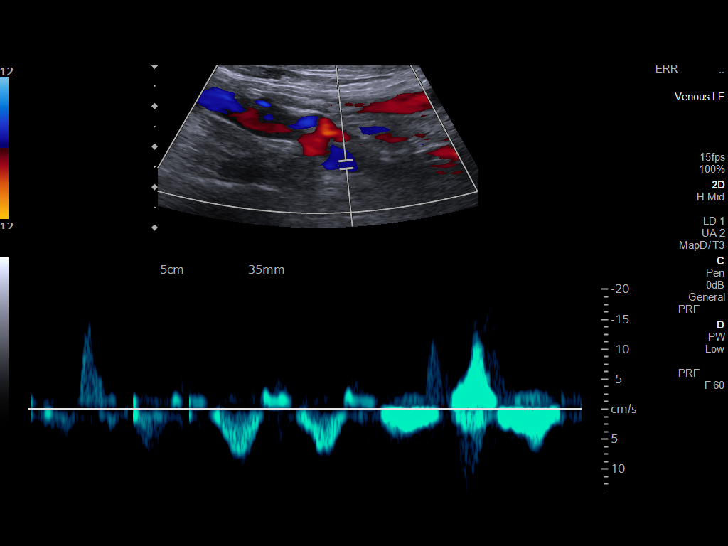
[im 18/67]
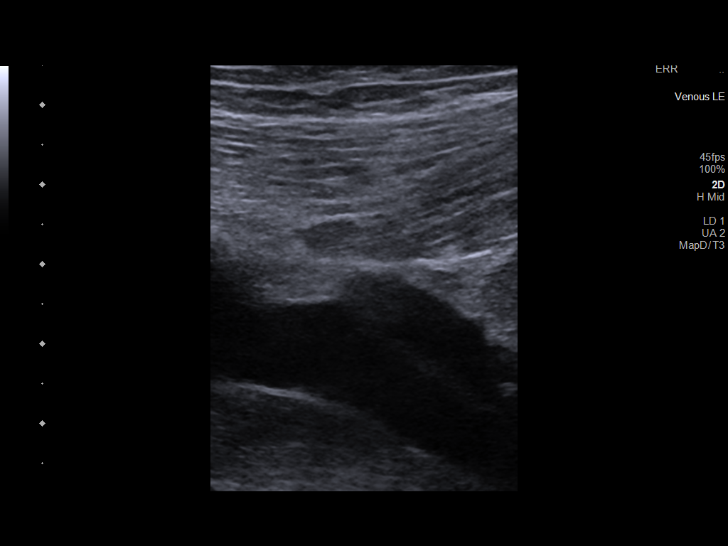
[im 23/67]
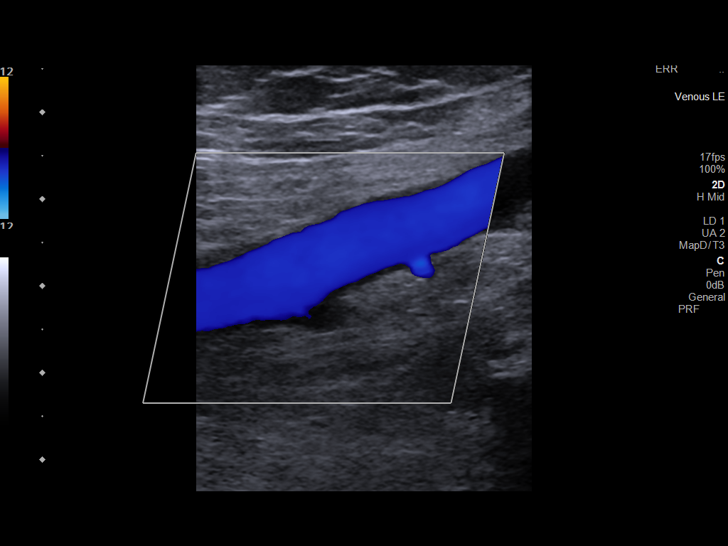
[im 29/67]
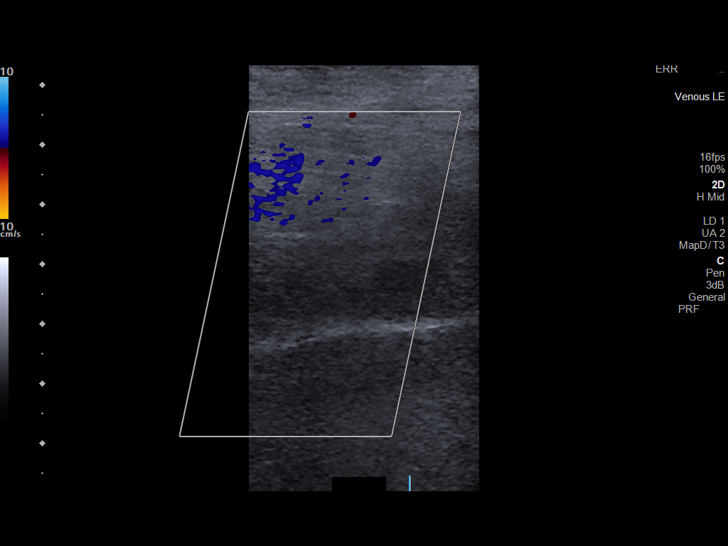
[im 35/67]
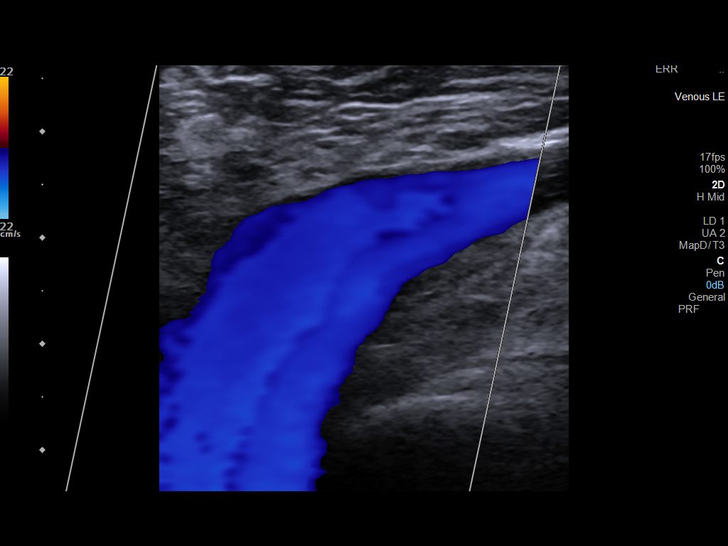
[im 38/67]
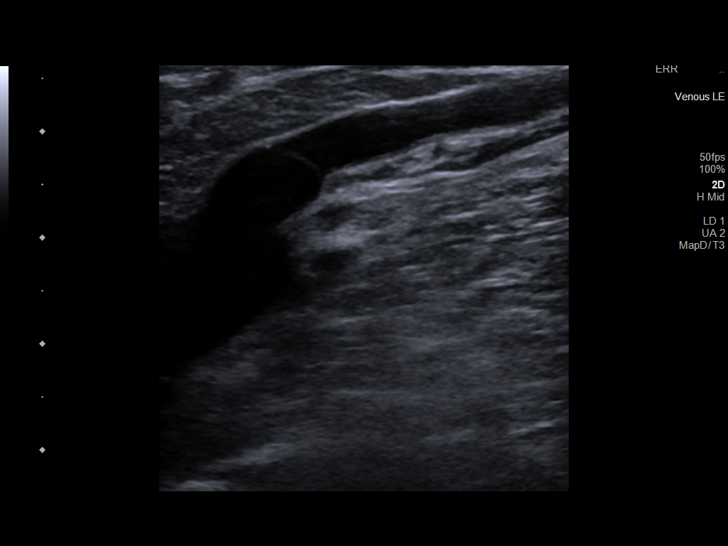
[im 44/67]
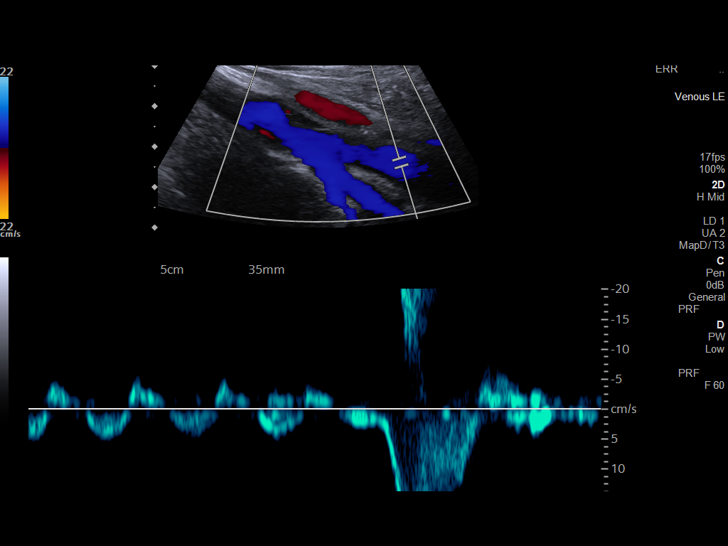
[im 49/67]
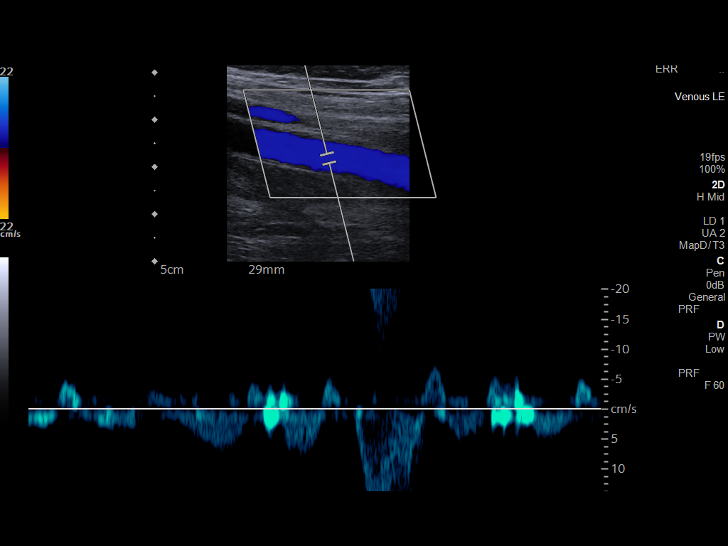
[im 55/67]
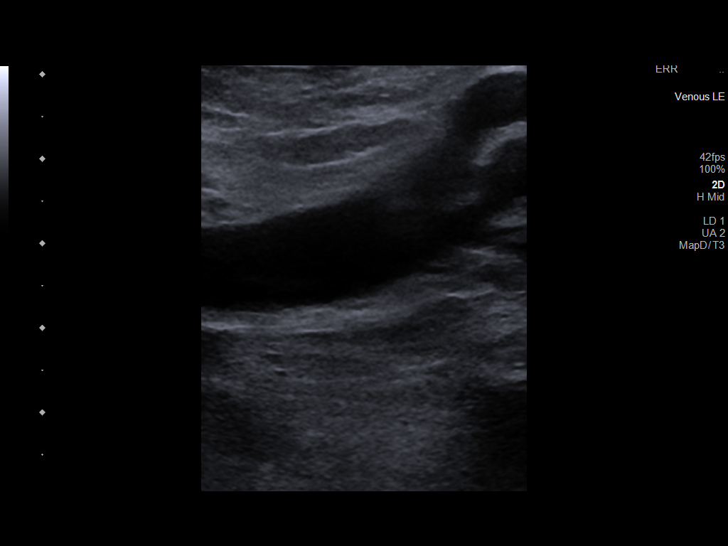
[im 61/67]
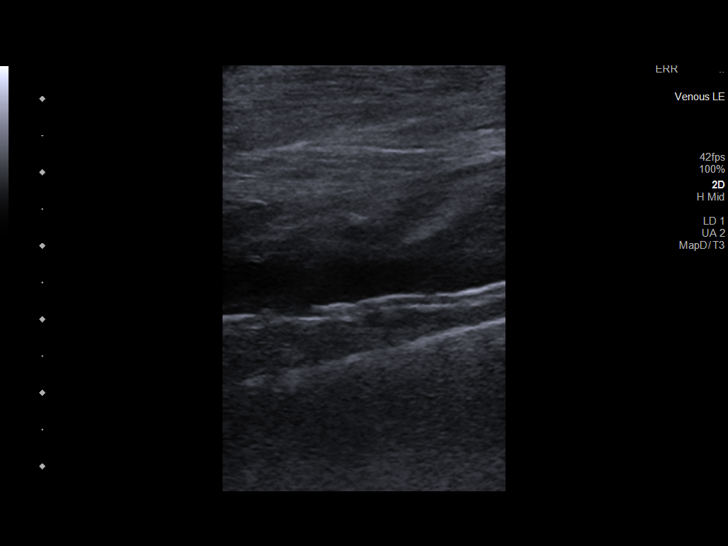
[im 67/67]
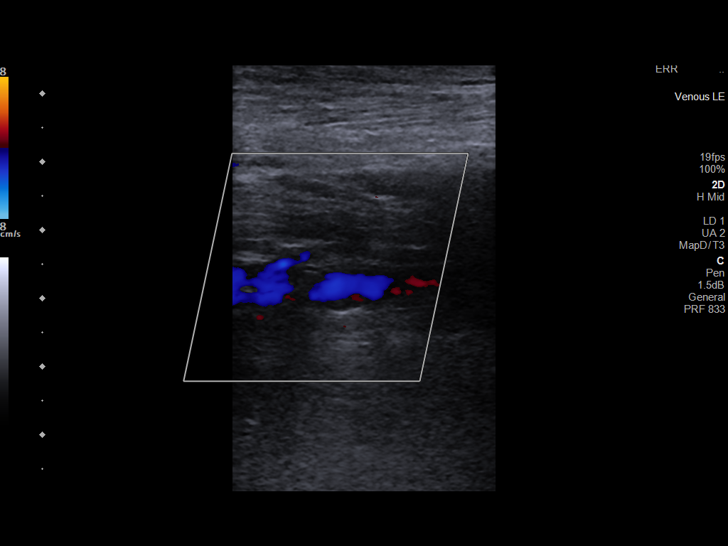

[13 of 24 positions shown; findings below may reference images not displayed]

FINDINGS: RIGHT LOWER EXTREMITY

Common Femoral Vein: No evidence of thrombus. Normal
compressibility, respiratory phasicity and response to augmentation.

Saphenofemoral Junction: No evidence of thrombus. Normal
compressibility and flow on color Doppler imaging.

Profunda Femoral Vein: No evidence of thrombus. Normal
compressibility and flow on color Doppler imaging.

Femoral Vein: No evidence of thrombus. Normal compressibility,
respiratory phasicity and response to augmentation.

Popliteal Vein: No evidence of thrombus. Normal compressibility,
respiratory phasicity and response to augmentation.

Calf Veins: The peroneal veins are not visualized. No visible
thrombus. Normal compressibility and flow on color Doppler imaging.

Superficial Great Saphenous Vein: No evidence of thrombus. Normal
compressibility.

Venous Reflux:  None.

LEFT LOWER EXTREMITY

Common Femoral Vein: No evidence of thrombus. Normal
compressibility, respiratory phasicity and response to augmentation.

Saphenofemoral Junction: No evidence of thrombus. Normal
compressibility and flow on color Doppler imaging.

Profunda Femoral Vein: No evidence of thrombus. Normal
compressibility and flow on color Doppler imaging.

Femoral Vein: No evidence of thrombus. Normal compressibility,
respiratory phasicity and response to augmentation.

Popliteal Vein: No evidence of thrombus. Normal compressibility,
respiratory phasicity and response to augmentation.

Calf Veins: The peroneal veins are not visualized. No visible
thrombus. Normal compressibility and flow on color Doppler imaging.

Superficial Great Saphenous Vein: No evidence of thrombus. Normal
compressibility.

Venous Reflux:  None.
IMPRESSION: No evidence of deep venous thrombosis in either lower extremity.

## 2022-02-12 DIAGNOSIS — E785 Hyperlipidemia, unspecified: Secondary | ICD-10-CM | POA: Diagnosis not present

## 2022-02-12 DIAGNOSIS — Z6822 Body mass index (BMI) 22.0-22.9, adult: Secondary | ICD-10-CM | POA: Diagnosis not present

## 2022-02-12 DIAGNOSIS — E1143 Type 2 diabetes mellitus with diabetic autonomic (poly)neuropathy: Secondary | ICD-10-CM | POA: Diagnosis not present

## 2022-02-12 DIAGNOSIS — I10 Essential (primary) hypertension: Secondary | ICD-10-CM | POA: Diagnosis not present

## 2022-02-12 DIAGNOSIS — G629 Polyneuropathy, unspecified: Secondary | ICD-10-CM | POA: Diagnosis not present

## 2022-05-06 DIAGNOSIS — Z7984 Long term (current) use of oral hypoglycemic drugs: Secondary | ICD-10-CM | POA: Diagnosis not present

## 2022-05-06 DIAGNOSIS — E119 Type 2 diabetes mellitus without complications: Secondary | ICD-10-CM | POA: Diagnosis not present

## 2022-05-06 DIAGNOSIS — H353111 Nonexudative age-related macular degeneration, right eye, early dry stage: Secondary | ICD-10-CM | POA: Diagnosis not present

## 2022-05-06 DIAGNOSIS — Z961 Presence of intraocular lens: Secondary | ICD-10-CM | POA: Diagnosis not present

## 2022-05-06 DIAGNOSIS — H353122 Nonexudative age-related macular degeneration, left eye, intermediate dry stage: Secondary | ICD-10-CM | POA: Diagnosis not present

## 2022-05-29 DIAGNOSIS — I10 Essential (primary) hypertension: Secondary | ICD-10-CM | POA: Diagnosis not present

## 2022-05-29 DIAGNOSIS — D692 Other nonthrombocytopenic purpura: Secondary | ICD-10-CM | POA: Diagnosis not present

## 2022-05-29 DIAGNOSIS — G629 Polyneuropathy, unspecified: Secondary | ICD-10-CM | POA: Diagnosis not present

## 2022-05-29 DIAGNOSIS — Z6824 Body mass index (BMI) 24.0-24.9, adult: Secondary | ICD-10-CM | POA: Diagnosis not present

## 2022-05-29 DIAGNOSIS — E785 Hyperlipidemia, unspecified: Secondary | ICD-10-CM | POA: Diagnosis not present

## 2022-05-29 DIAGNOSIS — E1143 Type 2 diabetes mellitus with diabetic autonomic (poly)neuropathy: Secondary | ICD-10-CM | POA: Diagnosis not present

## 2022-05-30 DIAGNOSIS — E1143 Type 2 diabetes mellitus with diabetic autonomic (poly)neuropathy: Secondary | ICD-10-CM | POA: Diagnosis not present

## 2022-07-15 DIAGNOSIS — M1711 Unilateral primary osteoarthritis, right knee: Secondary | ICD-10-CM | POA: Diagnosis not present

## 2022-07-15 DIAGNOSIS — Z20822 Contact with and (suspected) exposure to covid-19: Secondary | ICD-10-CM | POA: Diagnosis not present

## 2022-07-15 DIAGNOSIS — M11261 Other chondrocalcinosis, right knee: Secondary | ICD-10-CM | POA: Diagnosis not present

## 2022-07-15 DIAGNOSIS — Z79899 Other long term (current) drug therapy: Secondary | ICD-10-CM | POA: Diagnosis not present

## 2022-07-15 DIAGNOSIS — E119 Type 2 diabetes mellitus without complications: Secondary | ICD-10-CM | POA: Diagnosis not present

## 2022-07-15 DIAGNOSIS — Z7984 Long term (current) use of oral hypoglycemic drugs: Secondary | ICD-10-CM | POA: Diagnosis not present

## 2022-07-15 DIAGNOSIS — E785 Hyperlipidemia, unspecified: Secondary | ICD-10-CM | POA: Diagnosis not present

## 2022-07-15 DIAGNOSIS — R001 Bradycardia, unspecified: Secondary | ICD-10-CM | POA: Diagnosis not present

## 2022-07-15 DIAGNOSIS — S8001XA Contusion of right knee, initial encounter: Secondary | ICD-10-CM | POA: Diagnosis not present

## 2022-07-15 DIAGNOSIS — Z794 Long term (current) use of insulin: Secondary | ICD-10-CM | POA: Diagnosis not present

## 2022-07-19 DIAGNOSIS — E119 Type 2 diabetes mellitus without complications: Secondary | ICD-10-CM | POA: Diagnosis not present

## 2022-07-19 DIAGNOSIS — Z79899 Other long term (current) drug therapy: Secondary | ICD-10-CM | POA: Diagnosis not present

## 2022-07-19 DIAGNOSIS — R7989 Other specified abnormal findings of blood chemistry: Secondary | ICD-10-CM | POA: Diagnosis not present

## 2022-07-19 DIAGNOSIS — N200 Calculus of kidney: Secondary | ICD-10-CM | POA: Diagnosis not present

## 2022-07-19 DIAGNOSIS — R059 Cough, unspecified: Secondary | ICD-10-CM | POA: Diagnosis not present

## 2022-07-19 DIAGNOSIS — I7121 Aneurysm of the ascending aorta, without rupture: Secondary | ICD-10-CM | POA: Diagnosis not present

## 2022-07-19 DIAGNOSIS — I4430 Unspecified atrioventricular block: Secondary | ICD-10-CM | POA: Diagnosis not present

## 2022-07-19 DIAGNOSIS — R079 Chest pain, unspecified: Secondary | ICD-10-CM | POA: Diagnosis not present

## 2022-07-19 DIAGNOSIS — I499 Cardiac arrhythmia, unspecified: Secondary | ICD-10-CM | POA: Diagnosis not present

## 2022-07-19 DIAGNOSIS — R569 Unspecified convulsions: Secondary | ICD-10-CM | POA: Diagnosis not present

## 2022-07-19 DIAGNOSIS — J90 Pleural effusion, not elsewhere classified: Secondary | ICD-10-CM | POA: Diagnosis not present

## 2022-07-19 DIAGNOSIS — R0789 Other chest pain: Secondary | ICD-10-CM | POA: Diagnosis not present

## 2022-07-19 DIAGNOSIS — Z7902 Long term (current) use of antithrombotics/antiplatelets: Secondary | ICD-10-CM | POA: Diagnosis not present

## 2022-07-19 DIAGNOSIS — I700 Atherosclerosis of aorta: Secondary | ICD-10-CM | POA: Diagnosis not present

## 2022-07-19 DIAGNOSIS — Z743 Need for continuous supervision: Secondary | ICD-10-CM | POA: Diagnosis not present

## 2022-07-22 DIAGNOSIS — R7989 Other specified abnormal findings of blood chemistry: Secondary | ICD-10-CM | POA: Diagnosis not present

## 2022-07-22 DIAGNOSIS — R079 Chest pain, unspecified: Secondary | ICD-10-CM | POA: Diagnosis not present

## 2022-08-09 DIAGNOSIS — E1143 Type 2 diabetes mellitus with diabetic autonomic (poly)neuropathy: Secondary | ICD-10-CM | POA: Diagnosis not present

## 2022-08-14 DIAGNOSIS — E119 Type 2 diabetes mellitus without complications: Secondary | ICD-10-CM | POA: Diagnosis not present

## 2022-09-04 DIAGNOSIS — E785 Hyperlipidemia, unspecified: Secondary | ICD-10-CM | POA: Diagnosis not present

## 2022-09-04 DIAGNOSIS — Z6823 Body mass index (BMI) 23.0-23.9, adult: Secondary | ICD-10-CM | POA: Diagnosis not present

## 2022-09-04 DIAGNOSIS — D692 Other nonthrombocytopenic purpura: Secondary | ICD-10-CM | POA: Diagnosis not present

## 2022-09-04 DIAGNOSIS — E1143 Type 2 diabetes mellitus with diabetic autonomic (poly)neuropathy: Secondary | ICD-10-CM | POA: Diagnosis not present

## 2022-09-04 DIAGNOSIS — G629 Polyneuropathy, unspecified: Secondary | ICD-10-CM | POA: Diagnosis not present

## 2022-09-04 DIAGNOSIS — I10 Essential (primary) hypertension: Secondary | ICD-10-CM | POA: Diagnosis not present

## 2022-10-26 DIAGNOSIS — Z8669 Personal history of other diseases of the nervous system and sense organs: Secondary | ICD-10-CM | POA: Diagnosis not present

## 2022-10-26 DIAGNOSIS — R54 Age-related physical debility: Secondary | ICD-10-CM | POA: Diagnosis not present

## 2022-10-26 DIAGNOSIS — Z7984 Long term (current) use of oral hypoglycemic drugs: Secondary | ICD-10-CM | POA: Diagnosis not present

## 2022-10-26 DIAGNOSIS — G629 Polyneuropathy, unspecified: Secondary | ICD-10-CM | POA: Diagnosis not present

## 2022-10-26 DIAGNOSIS — W1839XA Other fall on same level, initial encounter: Secondary | ICD-10-CM | POA: Diagnosis not present

## 2022-10-26 DIAGNOSIS — I716 Thoracoabdominal aortic aneurysm, without rupture, unspecified: Secondary | ICD-10-CM | POA: Diagnosis not present

## 2022-10-26 DIAGNOSIS — Z79899 Other long term (current) drug therapy: Secondary | ICD-10-CM | POA: Diagnosis not present

## 2022-10-26 DIAGNOSIS — W19XXXD Unspecified fall, subsequent encounter: Secondary | ICD-10-CM | POA: Diagnosis not present

## 2022-10-26 DIAGNOSIS — L89812 Pressure ulcer of head, stage 2: Secondary | ICD-10-CM | POA: Diagnosis not present

## 2022-10-26 DIAGNOSIS — M542 Cervicalgia: Secondary | ICD-10-CM | POA: Diagnosis not present

## 2022-10-26 DIAGNOSIS — E785 Hyperlipidemia, unspecified: Secondary | ICD-10-CM | POA: Diagnosis not present

## 2022-10-26 DIAGNOSIS — E1142 Type 2 diabetes mellitus with diabetic polyneuropathy: Secondary | ICD-10-CM | POA: Diagnosis not present

## 2022-10-26 DIAGNOSIS — W19XXXA Unspecified fall, initial encounter: Secondary | ICD-10-CM | POA: Diagnosis not present

## 2022-10-26 DIAGNOSIS — R1312 Dysphagia, oropharyngeal phase: Secondary | ICD-10-CM | POA: Diagnosis not present

## 2022-10-26 DIAGNOSIS — Z743 Need for continuous supervision: Secondary | ICD-10-CM | POA: Diagnosis not present

## 2022-10-26 DIAGNOSIS — L89892 Pressure ulcer of other site, stage 2: Secondary | ICD-10-CM | POA: Diagnosis not present

## 2022-10-26 DIAGNOSIS — E119 Type 2 diabetes mellitus without complications: Secondary | ICD-10-CM | POA: Diagnosis not present

## 2022-10-26 DIAGNOSIS — I609 Nontraumatic subarachnoid hemorrhage, unspecified: Secondary | ICD-10-CM | POA: Diagnosis not present

## 2022-10-26 DIAGNOSIS — M6281 Muscle weakness (generalized): Secondary | ICD-10-CM | POA: Diagnosis not present

## 2022-10-26 DIAGNOSIS — K4090 Unilateral inguinal hernia, without obstruction or gangrene, not specified as recurrent: Secondary | ICD-10-CM | POA: Diagnosis not present

## 2022-10-26 DIAGNOSIS — R1311 Dysphagia, oral phase: Secondary | ICD-10-CM | POA: Diagnosis not present

## 2022-10-26 DIAGNOSIS — R296 Repeated falls: Secondary | ICD-10-CM | POA: Diagnosis not present

## 2022-10-26 DIAGNOSIS — S12100D Unspecified displaced fracture of second cervical vertebra, subsequent encounter for fracture with routine healing: Secondary | ICD-10-CM | POA: Diagnosis not present

## 2022-10-26 DIAGNOSIS — D696 Thrombocytopenia, unspecified: Secondary | ICD-10-CM | POA: Diagnosis not present

## 2022-10-26 DIAGNOSIS — S0990XA Unspecified injury of head, initial encounter: Secondary | ICD-10-CM | POA: Diagnosis not present

## 2022-10-26 DIAGNOSIS — I499 Cardiac arrhythmia, unspecified: Secondary | ICD-10-CM | POA: Diagnosis not present

## 2022-10-26 DIAGNOSIS — R1310 Dysphagia, unspecified: Secondary | ICD-10-CM | POA: Diagnosis not present

## 2022-10-26 DIAGNOSIS — R5381 Other malaise: Secondary | ICD-10-CM | POA: Diagnosis not present

## 2022-10-26 DIAGNOSIS — M810 Age-related osteoporosis without current pathological fracture: Secondary | ICD-10-CM | POA: Diagnosis not present

## 2022-10-26 DIAGNOSIS — Z7983 Long term (current) use of bisphosphonates: Secondary | ICD-10-CM | POA: Diagnosis not present

## 2022-10-26 DIAGNOSIS — Z794 Long term (current) use of insulin: Secondary | ICD-10-CM | POA: Diagnosis not present

## 2022-10-26 DIAGNOSIS — Z7982 Long term (current) use of aspirin: Secondary | ICD-10-CM | POA: Diagnosis not present

## 2022-10-26 DIAGNOSIS — S12100A Unspecified displaced fracture of second cervical vertebra, initial encounter for closed fracture: Secondary | ICD-10-CM | POA: Diagnosis not present

## 2022-10-26 DIAGNOSIS — R471 Dysarthria and anarthria: Secondary | ICD-10-CM | POA: Diagnosis not present

## 2022-10-26 DIAGNOSIS — E1140 Type 2 diabetes mellitus with diabetic neuropathy, unspecified: Secondary | ICD-10-CM | POA: Diagnosis not present

## 2022-10-26 DIAGNOSIS — R1313 Dysphagia, pharyngeal phase: Secondary | ICD-10-CM | POA: Diagnosis not present

## 2022-10-26 DIAGNOSIS — N200 Calculus of kidney: Secondary | ICD-10-CM | POA: Diagnosis not present

## 2022-10-26 DIAGNOSIS — E860 Dehydration: Secondary | ICD-10-CM | POA: Diagnosis not present

## 2022-10-26 DIAGNOSIS — Z602 Problems related to living alone: Secondary | ICD-10-CM | POA: Diagnosis not present

## 2022-10-26 DIAGNOSIS — S12112A Nondisplaced Type II dens fracture, initial encounter for closed fracture: Secondary | ICD-10-CM | POA: Diagnosis not present

## 2022-10-26 DIAGNOSIS — I959 Hypotension, unspecified: Secondary | ICD-10-CM | POA: Diagnosis not present

## 2022-10-26 DIAGNOSIS — J9811 Atelectasis: Secondary | ICD-10-CM | POA: Diagnosis not present

## 2022-10-26 DIAGNOSIS — R569 Unspecified convulsions: Secondary | ICD-10-CM | POA: Diagnosis not present

## 2022-10-26 DIAGNOSIS — R531 Weakness: Secondary | ICD-10-CM | POA: Diagnosis not present

## 2022-10-26 DIAGNOSIS — I10 Essential (primary) hypertension: Secondary | ICD-10-CM | POA: Diagnosis not present

## 2022-10-26 DIAGNOSIS — Z931 Gastrostomy status: Secondary | ICD-10-CM | POA: Diagnosis not present

## 2022-10-26 DIAGNOSIS — S12101A Unspecified nondisplaced fracture of second cervical vertebra, initial encounter for closed fracture: Secondary | ICD-10-CM | POA: Diagnosis not present

## 2022-10-27 DIAGNOSIS — S12100A Unspecified displaced fracture of second cervical vertebra, initial encounter for closed fracture: Secondary | ICD-10-CM | POA: Insufficient documentation

## 2022-10-28 DIAGNOSIS — D696 Thrombocytopenia, unspecified: Secondary | ICD-10-CM | POA: Diagnosis not present

## 2022-10-28 DIAGNOSIS — W19XXXD Unspecified fall, subsequent encounter: Secondary | ICD-10-CM | POA: Diagnosis not present

## 2022-10-28 DIAGNOSIS — S12100D Unspecified displaced fracture of second cervical vertebra, subsequent encounter for fracture with routine healing: Secondary | ICD-10-CM | POA: Diagnosis not present

## 2022-10-28 DIAGNOSIS — E119 Type 2 diabetes mellitus without complications: Secondary | ICD-10-CM | POA: Diagnosis not present

## 2022-10-28 DIAGNOSIS — I959 Hypotension, unspecified: Secondary | ICD-10-CM | POA: Diagnosis not present

## 2022-10-28 DIAGNOSIS — Z8669 Personal history of other diseases of the nervous system and sense organs: Secondary | ICD-10-CM | POA: Diagnosis not present

## 2022-10-28 DIAGNOSIS — G629 Polyneuropathy, unspecified: Secondary | ICD-10-CM | POA: Diagnosis not present

## 2022-10-28 DIAGNOSIS — Z79899 Other long term (current) drug therapy: Secondary | ICD-10-CM | POA: Diagnosis not present

## 2022-10-29 DIAGNOSIS — D696 Thrombocytopenia, unspecified: Secondary | ICD-10-CM | POA: Diagnosis not present

## 2022-10-29 DIAGNOSIS — E785 Hyperlipidemia, unspecified: Secondary | ICD-10-CM | POA: Diagnosis not present

## 2022-10-29 DIAGNOSIS — Z79899 Other long term (current) drug therapy: Secondary | ICD-10-CM | POA: Diagnosis not present

## 2022-10-29 DIAGNOSIS — W19XXXD Unspecified fall, subsequent encounter: Secondary | ICD-10-CM | POA: Diagnosis not present

## 2022-10-29 DIAGNOSIS — E119 Type 2 diabetes mellitus without complications: Secondary | ICD-10-CM | POA: Diagnosis not present

## 2022-10-29 DIAGNOSIS — R1310 Dysphagia, unspecified: Secondary | ICD-10-CM | POA: Diagnosis not present

## 2022-10-29 DIAGNOSIS — S12100D Unspecified displaced fracture of second cervical vertebra, subsequent encounter for fracture with routine healing: Secondary | ICD-10-CM | POA: Diagnosis not present

## 2022-10-30 DIAGNOSIS — D696 Thrombocytopenia, unspecified: Secondary | ICD-10-CM | POA: Diagnosis not present

## 2022-10-30 DIAGNOSIS — S12100D Unspecified displaced fracture of second cervical vertebra, subsequent encounter for fracture with routine healing: Secondary | ICD-10-CM | POA: Diagnosis not present

## 2022-10-30 DIAGNOSIS — E785 Hyperlipidemia, unspecified: Secondary | ICD-10-CM | POA: Diagnosis not present

## 2022-10-30 DIAGNOSIS — Z79899 Other long term (current) drug therapy: Secondary | ICD-10-CM | POA: Diagnosis not present

## 2022-10-30 DIAGNOSIS — W19XXXD Unspecified fall, subsequent encounter: Secondary | ICD-10-CM | POA: Diagnosis not present

## 2022-10-30 DIAGNOSIS — E119 Type 2 diabetes mellitus without complications: Secondary | ICD-10-CM | POA: Diagnosis not present

## 2022-10-30 DIAGNOSIS — R1310 Dysphagia, unspecified: Secondary | ICD-10-CM | POA: Diagnosis not present

## 2022-10-31 DIAGNOSIS — R1310 Dysphagia, unspecified: Secondary | ICD-10-CM | POA: Diagnosis not present

## 2022-10-31 DIAGNOSIS — Z79899 Other long term (current) drug therapy: Secondary | ICD-10-CM | POA: Diagnosis not present

## 2022-10-31 DIAGNOSIS — W19XXXD Unspecified fall, subsequent encounter: Secondary | ICD-10-CM | POA: Diagnosis not present

## 2022-10-31 DIAGNOSIS — S12100D Unspecified displaced fracture of second cervical vertebra, subsequent encounter for fracture with routine healing: Secondary | ICD-10-CM | POA: Diagnosis not present

## 2022-10-31 DIAGNOSIS — E785 Hyperlipidemia, unspecified: Secondary | ICD-10-CM | POA: Diagnosis not present

## 2022-10-31 DIAGNOSIS — E119 Type 2 diabetes mellitus without complications: Secondary | ICD-10-CM | POA: Diagnosis not present

## 2022-10-31 DIAGNOSIS — D696 Thrombocytopenia, unspecified: Secondary | ICD-10-CM | POA: Diagnosis not present

## 2022-11-01 DIAGNOSIS — Z794 Long term (current) use of insulin: Secondary | ICD-10-CM | POA: Diagnosis not present

## 2022-11-01 DIAGNOSIS — E785 Hyperlipidemia, unspecified: Secondary | ICD-10-CM | POA: Diagnosis not present

## 2022-11-01 DIAGNOSIS — D696 Thrombocytopenia, unspecified: Secondary | ICD-10-CM | POA: Diagnosis not present

## 2022-11-01 DIAGNOSIS — R1310 Dysphagia, unspecified: Secondary | ICD-10-CM | POA: Diagnosis not present

## 2022-11-01 DIAGNOSIS — E119 Type 2 diabetes mellitus without complications: Secondary | ICD-10-CM | POA: Diagnosis not present

## 2022-11-01 DIAGNOSIS — Z79899 Other long term (current) drug therapy: Secondary | ICD-10-CM | POA: Diagnosis not present

## 2022-11-01 DIAGNOSIS — S12100D Unspecified displaced fracture of second cervical vertebra, subsequent encounter for fracture with routine healing: Secondary | ICD-10-CM | POA: Diagnosis not present

## 2022-11-01 DIAGNOSIS — W19XXXD Unspecified fall, subsequent encounter: Secondary | ICD-10-CM | POA: Diagnosis not present

## 2022-11-02 DIAGNOSIS — Z931 Gastrostomy status: Secondary | ICD-10-CM | POA: Diagnosis not present

## 2022-11-02 DIAGNOSIS — D696 Thrombocytopenia, unspecified: Secondary | ICD-10-CM | POA: Diagnosis not present

## 2022-11-02 DIAGNOSIS — E785 Hyperlipidemia, unspecified: Secondary | ICD-10-CM | POA: Diagnosis not present

## 2022-11-02 DIAGNOSIS — S12100D Unspecified displaced fracture of second cervical vertebra, subsequent encounter for fracture with routine healing: Secondary | ICD-10-CM | POA: Diagnosis not present

## 2022-11-02 DIAGNOSIS — Z79899 Other long term (current) drug therapy: Secondary | ICD-10-CM | POA: Diagnosis not present

## 2022-11-02 DIAGNOSIS — E119 Type 2 diabetes mellitus without complications: Secondary | ICD-10-CM | POA: Diagnosis not present

## 2022-11-02 DIAGNOSIS — R1310 Dysphagia, unspecified: Secondary | ICD-10-CM | POA: Diagnosis not present

## 2022-11-02 DIAGNOSIS — W19XXXD Unspecified fall, subsequent encounter: Secondary | ICD-10-CM | POA: Diagnosis not present

## 2022-11-03 DIAGNOSIS — E785 Hyperlipidemia, unspecified: Secondary | ICD-10-CM | POA: Diagnosis not present

## 2022-11-03 DIAGNOSIS — W19XXXD Unspecified fall, subsequent encounter: Secondary | ICD-10-CM | POA: Diagnosis not present

## 2022-11-03 DIAGNOSIS — R1310 Dysphagia, unspecified: Secondary | ICD-10-CM | POA: Diagnosis not present

## 2022-11-03 DIAGNOSIS — Z931 Gastrostomy status: Secondary | ICD-10-CM | POA: Diagnosis not present

## 2022-11-03 DIAGNOSIS — D696 Thrombocytopenia, unspecified: Secondary | ICD-10-CM | POA: Diagnosis not present

## 2022-11-03 DIAGNOSIS — Z79899 Other long term (current) drug therapy: Secondary | ICD-10-CM | POA: Diagnosis not present

## 2022-11-03 DIAGNOSIS — S12100D Unspecified displaced fracture of second cervical vertebra, subsequent encounter for fracture with routine healing: Secondary | ICD-10-CM | POA: Diagnosis not present

## 2022-11-03 DIAGNOSIS — E119 Type 2 diabetes mellitus without complications: Secondary | ICD-10-CM | POA: Diagnosis not present

## 2022-11-04 DIAGNOSIS — E119 Type 2 diabetes mellitus without complications: Secondary | ICD-10-CM | POA: Diagnosis not present

## 2022-11-04 DIAGNOSIS — S12100D Unspecified displaced fracture of second cervical vertebra, subsequent encounter for fracture with routine healing: Secondary | ICD-10-CM | POA: Diagnosis not present

## 2022-11-04 DIAGNOSIS — R1310 Dysphagia, unspecified: Secondary | ICD-10-CM | POA: Diagnosis not present

## 2022-11-04 DIAGNOSIS — D696 Thrombocytopenia, unspecified: Secondary | ICD-10-CM | POA: Diagnosis not present

## 2022-11-04 DIAGNOSIS — L89892 Pressure ulcer of other site, stage 2: Secondary | ICD-10-CM | POA: Diagnosis not present

## 2022-11-04 DIAGNOSIS — Z79899 Other long term (current) drug therapy: Secondary | ICD-10-CM | POA: Diagnosis not present

## 2022-11-04 DIAGNOSIS — W19XXXD Unspecified fall, subsequent encounter: Secondary | ICD-10-CM | POA: Diagnosis not present

## 2022-11-05 DIAGNOSIS — E7800 Pure hypercholesterolemia, unspecified: Secondary | ICD-10-CM | POA: Diagnosis not present

## 2022-11-05 DIAGNOSIS — N183 Chronic kidney disease, stage 3 unspecified: Secondary | ICD-10-CM | POA: Diagnosis not present

## 2022-11-05 DIAGNOSIS — M810 Age-related osteoporosis without current pathological fracture: Secondary | ICD-10-CM | POA: Diagnosis not present

## 2022-11-05 DIAGNOSIS — E119 Type 2 diabetes mellitus without complications: Secondary | ICD-10-CM | POA: Diagnosis not present

## 2022-11-05 DIAGNOSIS — T148XXA Other injury of unspecified body region, initial encounter: Secondary | ICD-10-CM | POA: Diagnosis not present

## 2022-11-05 DIAGNOSIS — R5381 Other malaise: Secondary | ICD-10-CM | POA: Diagnosis not present

## 2022-11-05 DIAGNOSIS — S12110S Anterior displaced Type II dens fracture, sequela: Secondary | ICD-10-CM | POA: Diagnosis not present

## 2022-11-05 DIAGNOSIS — R1312 Dysphagia, oropharyngeal phase: Secondary | ICD-10-CM | POA: Diagnosis not present

## 2022-11-05 DIAGNOSIS — I10 Essential (primary) hypertension: Secondary | ICD-10-CM | POA: Diagnosis not present

## 2022-11-05 DIAGNOSIS — S12100A Unspecified displaced fracture of second cervical vertebra, initial encounter for closed fracture: Secondary | ICD-10-CM | POA: Diagnosis not present

## 2022-11-05 DIAGNOSIS — I609 Nontraumatic subarachnoid hemorrhage, unspecified: Secondary | ICD-10-CM | POA: Diagnosis not present

## 2022-11-05 DIAGNOSIS — R1313 Dysphagia, pharyngeal phase: Secondary | ICD-10-CM | POA: Diagnosis not present

## 2022-11-05 DIAGNOSIS — I517 Cardiomegaly: Secondary | ICD-10-CM | POA: Diagnosis not present

## 2022-11-05 DIAGNOSIS — I716 Thoracoabdominal aortic aneurysm, without rupture, unspecified: Secondary | ICD-10-CM | POA: Diagnosis not present

## 2022-11-05 DIAGNOSIS — I7121 Aneurysm of the ascending aorta, without rupture: Secondary | ICD-10-CM | POA: Diagnosis not present

## 2022-11-05 DIAGNOSIS — R339 Retention of urine, unspecified: Secondary | ICD-10-CM | POA: Diagnosis not present

## 2022-11-05 DIAGNOSIS — R569 Unspecified convulsions: Secondary | ICD-10-CM | POA: Diagnosis not present

## 2022-11-05 DIAGNOSIS — M542 Cervicalgia: Secondary | ICD-10-CM | POA: Diagnosis not present

## 2022-11-05 DIAGNOSIS — Z79899 Other long term (current) drug therapy: Secondary | ICD-10-CM | POA: Diagnosis not present

## 2022-11-05 DIAGNOSIS — M6281 Muscle weakness (generalized): Secondary | ICD-10-CM | POA: Diagnosis not present

## 2022-11-05 DIAGNOSIS — D696 Thrombocytopenia, unspecified: Secondary | ICD-10-CM | POA: Diagnosis not present

## 2022-11-05 DIAGNOSIS — L89814 Pressure ulcer of head, stage 4: Secondary | ICD-10-CM | POA: Diagnosis not present

## 2022-11-05 DIAGNOSIS — E785 Hyperlipidemia, unspecified: Secondary | ICD-10-CM | POA: Diagnosis not present

## 2022-11-05 DIAGNOSIS — S12100D Unspecified displaced fracture of second cervical vertebra, subsequent encounter for fracture with routine healing: Secondary | ICD-10-CM | POA: Diagnosis not present

## 2022-11-05 DIAGNOSIS — Z5181 Encounter for therapeutic drug level monitoring: Secondary | ICD-10-CM | POA: Diagnosis not present

## 2022-11-05 DIAGNOSIS — J9811 Atelectasis: Secondary | ICD-10-CM | POA: Diagnosis not present

## 2022-11-05 DIAGNOSIS — S12101A Unspecified nondisplaced fracture of second cervical vertebra, initial encounter for closed fracture: Secondary | ICD-10-CM | POA: Diagnosis not present

## 2022-11-05 DIAGNOSIS — L089 Local infection of the skin and subcutaneous tissue, unspecified: Secondary | ICD-10-CM | POA: Diagnosis not present

## 2022-11-05 DIAGNOSIS — R410 Disorientation, unspecified: Secondary | ICD-10-CM | POA: Diagnosis not present

## 2022-11-05 DIAGNOSIS — R1310 Dysphagia, unspecified: Secondary | ICD-10-CM | POA: Diagnosis not present

## 2022-11-05 DIAGNOSIS — N200 Calculus of kidney: Secondary | ICD-10-CM | POA: Diagnosis not present

## 2022-11-05 DIAGNOSIS — R471 Dysarthria and anarthria: Secondary | ICD-10-CM | POA: Diagnosis not present

## 2022-11-05 DIAGNOSIS — E440 Moderate protein-calorie malnutrition: Secondary | ICD-10-CM | POA: Diagnosis not present

## 2022-11-05 DIAGNOSIS — G629 Polyneuropathy, unspecified: Secondary | ICD-10-CM | POA: Diagnosis not present

## 2022-11-05 DIAGNOSIS — R627 Adult failure to thrive: Secondary | ICD-10-CM | POA: Diagnosis not present

## 2022-11-05 DIAGNOSIS — Z978 Presence of other specified devices: Secondary | ICD-10-CM | POA: Diagnosis not present

## 2022-11-05 DIAGNOSIS — Z452 Encounter for adjustment and management of vascular access device: Secondary | ICD-10-CM | POA: Diagnosis not present

## 2022-11-06 DIAGNOSIS — Z79899 Other long term (current) drug therapy: Secondary | ICD-10-CM | POA: Diagnosis not present

## 2022-11-06 DIAGNOSIS — M6281 Muscle weakness (generalized): Secondary | ICD-10-CM | POA: Diagnosis not present

## 2022-11-06 DIAGNOSIS — S12101A Unspecified nondisplaced fracture of second cervical vertebra, initial encounter for closed fracture: Secondary | ICD-10-CM | POA: Diagnosis not present

## 2022-11-06 DIAGNOSIS — L089 Local infection of the skin and subcutaneous tissue, unspecified: Secondary | ICD-10-CM | POA: Diagnosis not present

## 2022-11-06 DIAGNOSIS — R1310 Dysphagia, unspecified: Secondary | ICD-10-CM | POA: Diagnosis not present

## 2022-11-06 DIAGNOSIS — L89814 Pressure ulcer of head, stage 4: Secondary | ICD-10-CM | POA: Diagnosis not present

## 2022-11-06 DIAGNOSIS — E440 Moderate protein-calorie malnutrition: Secondary | ICD-10-CM | POA: Diagnosis not present

## 2022-11-06 DIAGNOSIS — E119 Type 2 diabetes mellitus without complications: Secondary | ICD-10-CM | POA: Diagnosis not present

## 2022-11-06 DIAGNOSIS — D696 Thrombocytopenia, unspecified: Secondary | ICD-10-CM | POA: Diagnosis not present

## 2022-11-06 DIAGNOSIS — I10 Essential (primary) hypertension: Secondary | ICD-10-CM | POA: Diagnosis not present

## 2022-11-06 DIAGNOSIS — E785 Hyperlipidemia, unspecified: Secondary | ICD-10-CM | POA: Diagnosis not present

## 2022-11-07 DIAGNOSIS — M6281 Muscle weakness (generalized): Secondary | ICD-10-CM | POA: Diagnosis not present

## 2022-11-07 DIAGNOSIS — Z79899 Other long term (current) drug therapy: Secondary | ICD-10-CM | POA: Diagnosis not present

## 2022-11-07 DIAGNOSIS — E119 Type 2 diabetes mellitus without complications: Secondary | ICD-10-CM | POA: Diagnosis not present

## 2022-11-07 DIAGNOSIS — E7800 Pure hypercholesterolemia, unspecified: Secondary | ICD-10-CM | POA: Diagnosis not present

## 2022-11-08 DIAGNOSIS — I10 Essential (primary) hypertension: Secondary | ICD-10-CM | POA: Diagnosis not present

## 2022-11-08 DIAGNOSIS — S12101A Unspecified nondisplaced fracture of second cervical vertebra, initial encounter for closed fracture: Secondary | ICD-10-CM | POA: Diagnosis not present

## 2022-11-08 DIAGNOSIS — E119 Type 2 diabetes mellitus without complications: Secondary | ICD-10-CM | POA: Diagnosis not present

## 2022-11-10 DIAGNOSIS — I517 Cardiomegaly: Secondary | ICD-10-CM | POA: Diagnosis not present

## 2022-11-10 DIAGNOSIS — J9811 Atelectasis: Secondary | ICD-10-CM | POA: Diagnosis not present

## 2022-11-10 DIAGNOSIS — Z452 Encounter for adjustment and management of vascular access device: Secondary | ICD-10-CM | POA: Diagnosis not present

## 2022-11-11 DIAGNOSIS — Z452 Encounter for adjustment and management of vascular access device: Secondary | ICD-10-CM | POA: Diagnosis not present

## 2022-11-12 DIAGNOSIS — E7800 Pure hypercholesterolemia, unspecified: Secondary | ICD-10-CM | POA: Diagnosis not present

## 2022-11-12 DIAGNOSIS — M6281 Muscle weakness (generalized): Secondary | ICD-10-CM | POA: Diagnosis not present

## 2022-11-12 DIAGNOSIS — Z79899 Other long term (current) drug therapy: Secondary | ICD-10-CM | POA: Diagnosis not present

## 2022-11-12 DIAGNOSIS — E119 Type 2 diabetes mellitus without complications: Secondary | ICD-10-CM | POA: Diagnosis not present

## 2022-11-13 DIAGNOSIS — G629 Polyneuropathy, unspecified: Secondary | ICD-10-CM | POA: Diagnosis not present

## 2022-11-13 DIAGNOSIS — L89814 Pressure ulcer of head, stage 4: Secondary | ICD-10-CM | POA: Diagnosis not present

## 2022-11-13 DIAGNOSIS — E119 Type 2 diabetes mellitus without complications: Secondary | ICD-10-CM | POA: Diagnosis not present

## 2022-11-13 DIAGNOSIS — E440 Moderate protein-calorie malnutrition: Secondary | ICD-10-CM | POA: Diagnosis not present

## 2022-11-14 DIAGNOSIS — L089 Local infection of the skin and subcutaneous tissue, unspecified: Secondary | ICD-10-CM | POA: Diagnosis not present

## 2022-11-14 DIAGNOSIS — T148XXA Other injury of unspecified body region, initial encounter: Secondary | ICD-10-CM | POA: Diagnosis not present

## 2022-11-14 DIAGNOSIS — E785 Hyperlipidemia, unspecified: Secondary | ICD-10-CM | POA: Diagnosis not present

## 2022-11-14 DIAGNOSIS — N183 Chronic kidney disease, stage 3 unspecified: Secondary | ICD-10-CM | POA: Diagnosis not present

## 2022-11-14 DIAGNOSIS — S12100A Unspecified displaced fracture of second cervical vertebra, initial encounter for closed fracture: Secondary | ICD-10-CM | POA: Diagnosis not present

## 2022-11-15 DIAGNOSIS — I10 Essential (primary) hypertension: Secondary | ICD-10-CM | POA: Diagnosis not present

## 2022-11-15 DIAGNOSIS — Z79899 Other long term (current) drug therapy: Secondary | ICD-10-CM | POA: Diagnosis not present

## 2022-11-18 ENCOUNTER — Non-Acute Institutional Stay: Payer: Self-pay | Admitting: Family Medicine

## 2022-11-18 VITALS — BP 108/68 | HR 88 | Temp 98.2°F | Resp 18 | Ht 73.0 in | Wt 147.0 lb

## 2022-11-18 DIAGNOSIS — N200 Calculus of kidney: Secondary | ICD-10-CM | POA: Diagnosis not present

## 2022-11-18 DIAGNOSIS — R1310 Dysphagia, unspecified: Secondary | ICD-10-CM | POA: Diagnosis not present

## 2022-11-18 DIAGNOSIS — N179 Acute kidney failure, unspecified: Secondary | ICD-10-CM

## 2022-11-18 DIAGNOSIS — I7121 Aneurysm of the ascending aorta, without rupture: Secondary | ICD-10-CM | POA: Diagnosis not present

## 2022-11-18 DIAGNOSIS — R410 Disorientation, unspecified: Secondary | ICD-10-CM

## 2022-11-18 DIAGNOSIS — S12110S Anterior displaced Type II dens fracture, sequela: Secondary | ICD-10-CM | POA: Diagnosis not present

## 2022-11-18 DIAGNOSIS — R339 Retention of urine, unspecified: Secondary | ICD-10-CM | POA: Diagnosis not present

## 2022-11-18 DIAGNOSIS — M542 Cervicalgia: Secondary | ICD-10-CM | POA: Diagnosis not present

## 2022-11-18 DIAGNOSIS — S12190D Other displaced fracture of second cervical vertebra, subsequent encounter for fracture with routine healing: Secondary | ICD-10-CM

## 2022-11-19 ENCOUNTER — Encounter: Payer: Self-pay | Admitting: Family Medicine

## 2022-11-19 DIAGNOSIS — E119 Type 2 diabetes mellitus without complications: Secondary | ICD-10-CM | POA: Insufficient documentation

## 2022-11-19 DIAGNOSIS — R569 Unspecified convulsions: Secondary | ICD-10-CM | POA: Insufficient documentation

## 2022-11-19 DIAGNOSIS — R1310 Dysphagia, unspecified: Secondary | ICD-10-CM | POA: Insufficient documentation

## 2022-11-19 DIAGNOSIS — E785 Hyperlipidemia, unspecified: Secondary | ICD-10-CM | POA: Insufficient documentation

## 2022-11-19 DIAGNOSIS — I712 Thoracic aortic aneurysm, without rupture, unspecified: Secondary | ICD-10-CM | POA: Insufficient documentation

## 2022-11-19 DIAGNOSIS — N179 Acute kidney failure, unspecified: Secondary | ICD-10-CM | POA: Insufficient documentation

## 2022-11-19 DIAGNOSIS — Z5181 Encounter for therapeutic drug level monitoring: Secondary | ICD-10-CM | POA: Diagnosis not present

## 2022-11-19 DIAGNOSIS — R339 Retention of urine, unspecified: Secondary | ICD-10-CM | POA: Insufficient documentation

## 2022-11-19 DIAGNOSIS — R410 Disorientation, unspecified: Secondary | ICD-10-CM | POA: Insufficient documentation

## 2022-11-19 NOTE — Progress Notes (Unsigned)
Therapist, nutritional Palliative Care Consult Note Telephone: [phone]  Fax: [phone]   Date of encounter: 11/18/22 4:35 pm PATIENT NAME: Kyle Santana [address] [place] [address]   [phone] (home)  DOB: 05/21/1931 MRN: [id number] PRIMARY CARE PROVIDER:    Toma Deiters, MD,  [address] [place] [address] [phone]  REFERRING PROVIDER:   Toma Deiters, MD [address] [place],  [place] [address] [phone]  Health Care Agent/Health Care Power of Attorney:    Contact Information     Name Relation Home Work Mobile   [facility] Other   [phone]   Sheralyn Boatman [phone]          I met face to face with patient in *** facility. Palliative Care was asked to follow this patient by consultation request of Hasanaj, Myra Gianotti, MD to address advance care planning and complex medical decision making. This is an initial/follow up visit.  ADVANCE CARE PLANNING/GOALS OF CARE: Patient/health care surrogate gave his/her permission to discuss.   PRESENT FOR DISCUSSION: GOALS: BARRIERS:  HEALTH CARE POA/HEALTH CARE DECISION MAKER AND PHONE NUMBER:  Our advance care planning conversation included a discussion about:    The value and importance of advance care planning  Experiences with loved ones who have been seriously ill or have died  Exploration of personal, cultural or spiritual beliefs that might influence medical decisions  Review, updating or creation of an advance directive document.  CODE STATUS:   I spent ***   minutes providing this consultation with more than 50% of the time spent on counseling patient and coordinating communication with referring provider, staff/family, chart review and documentation. --------------------------------------------------------------------------------------------------------------------------------------------------------------------------------------------  ASSESSMENT  AND / RECOMMENDATIONS:      PPS:  Follow up Palliative Care Visit:  Palliative Care continuing to follow up by monitoring for changes in appetite, weight, functional and cognitive status for chronic disease progression and management in agreement with patient's stated goals of care. Next visit in *** weeks or prn.  This visit was coded based on medical decision making (MDM).  Chief Complaint *** HISTORY OF PRESENT ILLNESS: NAME@ is a 89 y.o. year old male with *** .    ACTIVITIES OF DAILY LIVING: CONTINENT OF BLADDER? Y/N CONTINENT OF BOWEL? Y/N  MOBILITY:   INDEPENDENTLY AMBULATORY?  Y/N   AMBULATORY WITH ASSISTIVE DEVICE: WHEELCHAIR/POWER CHAIR? BEDBOUND?  APPETITE?  CURRENT PROBLEM LIST: There are no problems to display for this patient.  PAST MEDICAL HISTORY:  Active Ambulatory Problems    Diagnosis Date Noted   No Active Ambulatory Problems   Resolved Ambulatory Problems    Diagnosis Date Noted   No Resolved Ambulatory Problems   Past Medical History:  Diagnosis Date   Colon polyps    Diabetes mellitus without complication ([facility])    Hypercholesteremia    Hypertension    SOCIAL HX:  Social History   Tobacco Use   Smoking status: Never   Smokeless tobacco: Never  Substance Use Topics   Alcohol use: No   FAMILY HX: No family history on file.  ***   Preferred Pharmacy: ALLERGIES: No Known Allergies   PERTINENT MEDICATIONS:  Outpatient Encounter Medications as of 11/18/2022  Medication Sig   aspirin EC 81 MG tablet Take 1 tablet (81 mg total) by mouth daily.   benazepril (LOTENSIN) 5 MG tablet Take 1 tablet by mouth daily.   calcium carbonate (OSCAL) 1500 (600 Ca) MG TABS tablet Take 600 mg of elemental  calcium by mouth 2 (two) times daily with a meal.   metFORMIN (GLUCOPHAGE) 1000 MG tablet Take 1 tablet by mouth 2 (two) times daily.   Multiple Vitamins-Minerals (EYE VITAMINS PO) Take 1 tablet by mouth daily.   simvastatin (ZOCOR) 40 MG tablet Take 1  tablet by mouth daily.   sulfamethoxazole-trimethoprim (BACTRIM DS,SEPTRA DS) 800-160 MG tablet Take 1 tablet by mouth 2 (two) times daily.   traMADol (ULTRAM) 50 MG tablet Take 1 tablet (50 mg total) by mouth every 6 (six) hours as needed.   No facility-administered encounter medications on file as of 11/18/2022.    History obtained from review of EMR, discussion with primary team, and interview with family, facility staff/caregiver and/or patient.   CBC    Component Value Date/Time   WBC 4.6 09/29/2020 1651   RBC 3.21 (L) 09/29/2020 1651   HGB 11.8 (L) 09/29/2020 1651   HCT 37.4 (L) 09/29/2020 1651   PLT 81 (L) 09/29/2020 1651   MCV 116.5 (H) 09/29/2020 1651   MCH 36.8 (H) 09/29/2020 1651   MCHC 31.6 09/29/2020 1651   RDW 14.4 09/29/2020 1651   LYMPHSABS 1.4 09/29/2020 1651   MONOABS 0.4 09/29/2020 1651   EOSABS 0.0 09/29/2020 1651   BASOSABS 0.0 09/29/2020 1651    CMP    Latest Ref Rng & Units 09/29/2020    4:51 PM 03/24/2020    1:39 PM 03/15/2016    2:30 PM  CMP  Glucose 70 - 99 mg/dL 161  096  95   BUN 8 - 23 mg/dL Creatinine 0.61 - 1.24 mg/dL 0.45  4.09  8.11   Sodium 135 - 145 mmol/L 139  139  139   Potassium 3.5 - 5.1 mmol/L 4.5  4.6  5.3   Chloride 98 - 111 mmol/L 104  106  104   CO2 22 - 32 mmol/L Calcium 8.9 - 10.3 mg/dL 9.3  8.9  9.3   Total Protein 6.5 - 8.1 g/dL 6.9  7.0    Total Bilirubin 0.3 - 1.2 mg/dL 0.7  1.0    Alkaline Phos 38 - 126 U/L 59  46    AST 15 - 41 U/L 19  20    ALT 0 - 44 U/L 11  11      LFTs    Latest Ref Rng & Units 09/29/2020    4:51 PM 03/24/2020    1:39 PM  Hepatic Function  Total Protein 6.5 - 8.1 g/dL 6.9  7.0   Albumin 3.5 - 5.0 g/dL 4.0  4.5   AST 15 - 41 U/L 19  20   ALT 0 - 44 U/L 11  11   Alk Phosphatase 38 - 126 U/L 59  46   Total Bilirubin 0.3 - 1.2 mg/dL 0.7  1.0     Urinalysis No results found for: "COLORURINE", "APPEARANCEUR", "LABSPEC", "PHURINE", "GLUCOSEU", "HGBUR", "BILIRUBINUR",  "KETONESUR", "PROTEINUR", "UROBILINOGEN", "NITRITE", "LEUKOCYTESUR"  HGB A1c (last 3 results) @  BNP (last 3 results) No results for input(s): "BNP" in the last 8760 hours.  ProBNP (last 3 results) No results for input(s): "PROBNP" in the last 8760 hours.   I reviewed available labs, medications, imaging, studies and related documents from the EMR.  There were no new records/imaging since last visit. Records reviewed and summarized above.   Physical Exam: GENERAL: NAD LUNGS: CTAB, no increased work of breathing, room air CARDIAC:  S1S2, RRR with  no MRG, Y/N edema, Y/N cyanosis ABD:  Normo-active BS x 4 quads, soft, non-tender EXTREMITIES: Normal ROM, no deformity, strength equal, Y/N muscle atrophy/subcutaneous fat loss NEURO:  No weakness or cognitive impairment, aphasia-expressive/receptive  PSYCH:  non-anxious affect, A & O x 3  Thank you for the opportunity to participate in the care of ------. Please call our main office at [phone] if we can be of additional assistance.    Joycelyn Man FNP-C  [email or web address] .[facility]  Phone:  [phone]

## 2022-11-20 DIAGNOSIS — Z978 Presence of other specified devices: Secondary | ICD-10-CM | POA: Diagnosis not present

## 2022-11-20 DIAGNOSIS — R339 Retention of urine, unspecified: Secondary | ICD-10-CM | POA: Diagnosis not present

## 2022-11-20 DIAGNOSIS — L89814 Pressure ulcer of head, stage 4: Secondary | ICD-10-CM | POA: Diagnosis not present

## 2022-11-20 DIAGNOSIS — R627 Adult failure to thrive: Secondary | ICD-10-CM | POA: Diagnosis not present

## 2022-12-04 DEATH — deceased
